# Patient Record
Sex: Female | Born: 1940 | Race: Black or African American | Hispanic: No | Smoking: Former smoker
Health system: Southern US, Community
[De-identification: ages and names within clinical notes are randomized; demographics above are authoritative.]

## PROBLEM LIST (undated history)

## (undated) DIAGNOSIS — I639 Cerebral infarction, unspecified: Secondary | ICD-10-CM

## (undated) DIAGNOSIS — M1990 Unspecified osteoarthritis, unspecified site: Secondary | ICD-10-CM

## (undated) DIAGNOSIS — K219 Gastro-esophageal reflux disease without esophagitis: Secondary | ICD-10-CM

## (undated) DIAGNOSIS — I10 Essential (primary) hypertension: Secondary | ICD-10-CM

## (undated) DIAGNOSIS — F0390 Unspecified dementia without behavioral disturbance: Secondary | ICD-10-CM

## (undated) DIAGNOSIS — D649 Anemia, unspecified: Secondary | ICD-10-CM

## (undated) DIAGNOSIS — F329 Major depressive disorder, single episode, unspecified: Secondary | ICD-10-CM

## (undated) DIAGNOSIS — I509 Heart failure, unspecified: Secondary | ICD-10-CM

## (undated) DIAGNOSIS — I2510 Atherosclerotic heart disease of native coronary artery without angina pectoris: Secondary | ICD-10-CM

## (undated) DIAGNOSIS — N189 Chronic kidney disease, unspecified: Secondary | ICD-10-CM

## (undated) DIAGNOSIS — J45909 Unspecified asthma, uncomplicated: Secondary | ICD-10-CM

## (undated) DIAGNOSIS — J189 Pneumonia, unspecified organism: Secondary | ICD-10-CM

## (undated) DIAGNOSIS — F32A Depression, unspecified: Secondary | ICD-10-CM

## (undated) DIAGNOSIS — F419 Anxiety disorder, unspecified: Secondary | ICD-10-CM

## (undated) DIAGNOSIS — E119 Type 2 diabetes mellitus without complications: Secondary | ICD-10-CM

## (undated) HISTORY — PX: EYE SURGERY: SHX253

## (undated) HISTORY — PX: CATARACT EXTRACTION, BILATERAL: SHX1313

## (undated) HISTORY — PX: TUBAL LIGATION: SHX77

## (undated) HISTORY — PX: CORONARY ANGIOPLASTY: SHX604

---

## 2017-05-13 ENCOUNTER — Inpatient Hospital Stay (HOSPITAL_COMMUNITY)
Admission: EM | Admit: 2017-05-13 | Discharge: 2017-05-22 | DRG: 371 | Disposition: A | Discharge status: Home / Self Care | Payer: Medicare (Managed Care) | Attending: Internal Medicine | Admitting: Internal Medicine

## 2017-05-13 ENCOUNTER — Encounter (HOSPITAL_COMMUNITY): Payer: Self-pay

## 2017-05-13 ENCOUNTER — Emergency Department (HOSPITAL_COMMUNITY): Payer: Medicare (Managed Care)

## 2017-05-13 DIAGNOSIS — N179 Acute kidney failure, unspecified: Secondary | ICD-10-CM | POA: Diagnosis present

## 2017-05-13 DIAGNOSIS — E119 Type 2 diabetes mellitus without complications: Secondary | ICD-10-CM | POA: Diagnosis not present

## 2017-05-13 DIAGNOSIS — R7989 Other specified abnormal findings of blood chemistry: Secondary | ICD-10-CM | POA: Diagnosis present

## 2017-05-13 DIAGNOSIS — K529 Noninfective gastroenteritis and colitis, unspecified: Secondary | ICD-10-CM | POA: Diagnosis present

## 2017-05-13 DIAGNOSIS — Z8673 Personal history of transient ischemic attack (TIA), and cerebral infarction without residual deficits: Secondary | ICD-10-CM

## 2017-05-13 DIAGNOSIS — Z9861 Coronary angioplasty status: Secondary | ICD-10-CM | POA: Diagnosis not present

## 2017-05-13 DIAGNOSIS — I82492 Acute embolism and thrombosis of other specified deep vein of left lower extremity: Secondary | ICD-10-CM | POA: Diagnosis present

## 2017-05-13 DIAGNOSIS — Z7951 Long term (current) use of inhaled steroids: Secondary | ICD-10-CM

## 2017-05-13 DIAGNOSIS — K219 Gastro-esophageal reflux disease without esophagitis: Secondary | ICD-10-CM | POA: Diagnosis present

## 2017-05-13 DIAGNOSIS — G9341 Metabolic encephalopathy: Secondary | ICD-10-CM | POA: Diagnosis not present

## 2017-05-13 DIAGNOSIS — E876 Hypokalemia: Secondary | ICD-10-CM | POA: Diagnosis present

## 2017-05-13 DIAGNOSIS — E669 Obesity, unspecified: Secondary | ICD-10-CM | POA: Diagnosis present

## 2017-05-13 DIAGNOSIS — R439 Unspecified disturbances of smell and taste: Secondary | ICD-10-CM | POA: Diagnosis not present

## 2017-05-13 DIAGNOSIS — Z7984 Long term (current) use of oral hypoglycemic drugs: Secondary | ICD-10-CM

## 2017-05-13 DIAGNOSIS — Z6834 Body mass index (BMI) 34.0-34.9, adult: Secondary | ICD-10-CM

## 2017-05-13 DIAGNOSIS — Z86718 Personal history of other venous thrombosis and embolism: Secondary | ICD-10-CM | POA: Diagnosis not present

## 2017-05-13 DIAGNOSIS — A0472 Enterocolitis due to Clostridium difficile, not specified as recurrent: Principal | ICD-10-CM | POA: Diagnosis present

## 2017-05-13 DIAGNOSIS — T373X5A Adverse effect of other antiprotozoal drugs, initial encounter: Secondary | ICD-10-CM | POA: Diagnosis not present

## 2017-05-13 DIAGNOSIS — O2230 Deep phlebothrombosis in pregnancy, unspecified trimester: Secondary | ICD-10-CM | POA: Diagnosis present

## 2017-05-13 DIAGNOSIS — D509 Iron deficiency anemia, unspecified: Secondary | ICD-10-CM | POA: Diagnosis present

## 2017-05-13 DIAGNOSIS — K30 Functional dyspepsia: Secondary | ICD-10-CM | POA: Diagnosis not present

## 2017-05-13 DIAGNOSIS — K6289 Other specified diseases of anus and rectum: Secondary | ICD-10-CM | POA: Diagnosis present

## 2017-05-13 DIAGNOSIS — M7989 Other specified soft tissue disorders: Secondary | ICD-10-CM | POA: Diagnosis not present

## 2017-05-13 DIAGNOSIS — R197 Diarrhea, unspecified: Secondary | ICD-10-CM

## 2017-05-13 DIAGNOSIS — I2510 Atherosclerotic heart disease of native coronary artery without angina pectoris: Secondary | ICD-10-CM | POA: Diagnosis present

## 2017-05-13 DIAGNOSIS — K625 Hemorrhage of anus and rectum: Secondary | ICD-10-CM | POA: Diagnosis not present

## 2017-05-13 DIAGNOSIS — Z7902 Long term (current) use of antithrombotics/antiplatelets: Secondary | ICD-10-CM | POA: Diagnosis not present

## 2017-05-13 DIAGNOSIS — E860 Dehydration: Secondary | ICD-10-CM | POA: Diagnosis present

## 2017-05-13 DIAGNOSIS — E871 Hypo-osmolality and hyponatremia: Secondary | ICD-10-CM | POA: Diagnosis present

## 2017-05-13 DIAGNOSIS — K5289 Other specified noninfective gastroenteritis and colitis: Secondary | ICD-10-CM | POA: Diagnosis not present

## 2017-05-13 DIAGNOSIS — I10 Essential (primary) hypertension: Secondary | ICD-10-CM | POA: Diagnosis present

## 2017-05-13 DIAGNOSIS — F0390 Unspecified dementia without behavioral disturbance: Secondary | ICD-10-CM | POA: Diagnosis present

## 2017-05-13 DIAGNOSIS — E1165 Type 2 diabetes mellitus with hyperglycemia: Secondary | ICD-10-CM | POA: Diagnosis present

## 2017-05-13 DIAGNOSIS — Z7982 Long term (current) use of aspirin: Secondary | ICD-10-CM

## 2017-05-13 DIAGNOSIS — A040 Enteropathogenic Escherichia coli infection: Secondary | ICD-10-CM | POA: Diagnosis present

## 2017-05-13 DIAGNOSIS — R112 Nausea with vomiting, unspecified: Secondary | ICD-10-CM

## 2017-05-13 DIAGNOSIS — Z87891 Personal history of nicotine dependence: Secondary | ICD-10-CM | POA: Diagnosis not present

## 2017-05-13 DIAGNOSIS — I82402 Acute embolism and thrombosis of unspecified deep veins of left lower extremity: Secondary | ICD-10-CM | POA: Diagnosis not present

## 2017-05-13 DIAGNOSIS — M79609 Pain in unspecified limb: Secondary | ICD-10-CM | POA: Diagnosis not present

## 2017-05-13 HISTORY — DX: Unspecified asthma, uncomplicated: J45.909

## 2017-05-13 HISTORY — DX: Heart failure, unspecified: I50.9

## 2017-05-13 HISTORY — DX: Type 2 diabetes mellitus without complications: E11.9

## 2017-05-13 HISTORY — DX: Essential (primary) hypertension: I10

## 2017-05-13 HISTORY — DX: Anxiety disorder, unspecified: F41.9

## 2017-05-13 HISTORY — DX: Unspecified dementia, unspecified severity, without behavioral disturbance, psychotic disturbance, mood disturbance, and anxiety: F03.90

## 2017-05-13 HISTORY — DX: Cerebral infarction, unspecified: I63.9

## 2017-05-13 HISTORY — DX: Anemia, unspecified: D64.9

## 2017-05-13 HISTORY — DX: Pneumonia, unspecified organism: J18.9

## 2017-05-13 HISTORY — DX: Chronic kidney disease, unspecified: N18.9

## 2017-05-13 HISTORY — DX: Gastro-esophageal reflux disease without esophagitis: K21.9

## 2017-05-13 HISTORY — DX: Major depressive disorder, single episode, unspecified: F32.9

## 2017-05-13 HISTORY — DX: Atherosclerotic heart disease of native coronary artery without angina pectoris: I25.10

## 2017-05-13 HISTORY — DX: Unspecified osteoarthritis, unspecified site: M19.90

## 2017-05-13 HISTORY — DX: Depression, unspecified: F32.A

## 2017-05-13 LAB — CBC
HCT: 36.1 % (ref 36.0–46.0)
Hemoglobin: 12.7 g/dL (ref 12.0–15.0)
MCH: 22.4 pg — ABNORMAL LOW (ref 26.0–34.0)
MCHC: 35.2 g/dL (ref 30.0–36.0)
MCV: 63.8 fL — AB (ref 78.0–100.0)
PLATELETS: 500 10*3/uL — AB (ref 150–400)
RBC: 5.66 MIL/uL — AB (ref 3.87–5.11)
RDW: 15.1 % (ref 11.5–15.5)
WBC: 20.3 10*3/uL — AB (ref 4.0–10.5)

## 2017-05-13 LAB — GLUCOSE, CAPILLARY
GLUCOSE-CAPILLARY: 151 mg/dL — AB (ref 65–99)
Glucose-Capillary: 166 mg/dL — ABNORMAL HIGH (ref 65–99)

## 2017-05-13 LAB — COMPREHENSIVE METABOLIC PANEL
ALT: 32 U/L (ref 14–54)
AST: 45 U/L — AB (ref 15–41)
Albumin: 2.4 g/dL — ABNORMAL LOW (ref 3.5–5.0)
Alkaline Phosphatase: 75 U/L (ref 38–126)
Anion gap: 14 (ref 5–15)
BILIRUBIN TOTAL: 0.9 mg/dL (ref 0.3–1.2)
BUN: 23 mg/dL — AB (ref 6–20)
CO2: 25 mmol/L (ref 22–32)
CREATININE: 1.48 mg/dL — AB (ref 0.44–1.00)
Calcium: 8.3 mg/dL — ABNORMAL LOW (ref 8.9–10.3)
Chloride: 90 mmol/L — ABNORMAL LOW (ref 101–111)
GFR, EST AFRICAN AMERICAN: 38 mL/min — AB (ref >60)
GFR, EST NON AFRICAN AMERICAN: 33 mL/min — AB (ref >60)
Glucose, Bld: 276 mg/dL — ABNORMAL HIGH (ref 65–99)
Potassium: 2.4 mmol/L — CL (ref 3.5–5.1)
Sodium: 129 mmol/L — ABNORMAL LOW (ref 135–145)
TOTAL PROTEIN: 6.6 g/dL (ref 6.5–8.1)

## 2017-05-13 LAB — TSH: TSH: 0.41 u[IU]/mL (ref 0.350–4.500)

## 2017-05-13 LAB — LIPASE, BLOOD: LIPASE: 33 U/L (ref 11–51)

## 2017-05-13 LAB — C DIFFICILE QUICK SCREEN W PCR REFLEX
C DIFFICLE (CDIFF) ANTIGEN: POSITIVE — AB
C Diff interpretation: DETECTED
C Diff toxin: POSITIVE — AB

## 2017-05-13 LAB — PROTIME-INR
INR: 1.15
PROTHROMBIN TIME: 14.8 s (ref 11.4–15.2)

## 2017-05-13 LAB — MAGNESIUM: MAGNESIUM: 1.7 mg/dL (ref 1.7–2.4)

## 2017-05-13 MED ORDER — POTASSIUM CHLORIDE CRYS ER 20 MEQ PO TBCR
60.0000 meq | EXTENDED_RELEASE_TABLET | Freq: Four times a day (QID) | ORAL | Status: AC
  Administered 2017-05-13 (×2): 60 meq via ORAL
  Filled 2017-05-13 (×2): qty 3

## 2017-05-13 MED ORDER — HYDROCODONE-ACETAMINOPHEN 5-325 MG PO TABS
1.0000 | ORAL_TABLET | ORAL | Status: DC | PRN
  Administered 2017-05-14: 1 via ORAL
  Filled 2017-05-13: qty 1
  Filled 2017-05-13: qty 2
  Filled 2017-05-13: qty 1

## 2017-05-13 MED ORDER — ORAL CARE MOUTH RINSE
15.0000 mL | Freq: Two times a day (BID) | OROMUCOSAL | Status: DC
  Administered 2017-05-14 – 2017-05-21 (×10): 15 mL via OROMUCOSAL

## 2017-05-13 MED ORDER — INSULIN ASPART 100 UNIT/ML ~~LOC~~ SOLN
0.0000 [IU] | Freq: Three times a day (TID) | SUBCUTANEOUS | Status: DC
  Administered 2017-05-13: 2 [IU] via SUBCUTANEOUS
  Administered 2017-05-14 (×2): 1 [IU] via SUBCUTANEOUS
  Administered 2017-05-14: 2 [IU] via SUBCUTANEOUS
  Administered 2017-05-15 – 2017-05-16 (×3): 1 [IU] via SUBCUTANEOUS
  Administered 2017-05-17 – 2017-05-18 (×2): 2 [IU] via SUBCUTANEOUS
  Administered 2017-05-19 – 2017-05-21 (×8): 1 [IU] via SUBCUTANEOUS
  Administered 2017-05-22: 2 [IU] via SUBCUTANEOUS

## 2017-05-13 MED ORDER — ESCITALOPRAM OXALATE 10 MG PO TABS
10.0000 mg | ORAL_TABLET | Freq: Every day | ORAL | Status: DC
  Administered 2017-05-13 – 2017-05-22 (×9): 10 mg via ORAL
  Filled 2017-05-13 (×10): qty 1

## 2017-05-13 MED ORDER — CLOPIDOGREL BISULFATE 75 MG PO TABS
75.0000 mg | ORAL_TABLET | Freq: Every day | ORAL | Status: DC
  Administered 2017-05-13 – 2017-05-16 (×4): 75 mg via ORAL
  Filled 2017-05-13 (×4): qty 1

## 2017-05-13 MED ORDER — ALBUTEROL SULFATE (2.5 MG/3ML) 0.083% IN NEBU
2.5000 mg | INHALATION_SOLUTION | RESPIRATORY_TRACT | Status: DC | PRN
  Administered 2017-05-13 – 2017-05-20 (×4): 2.5 mg via RESPIRATORY_TRACT
  Filled 2017-05-13 (×4): qty 3

## 2017-05-13 MED ORDER — VANCOMYCIN 50 MG/ML ORAL SOLUTION
125.0000 mg | Freq: Four times a day (QID) | ORAL | Status: DC
  Administered 2017-05-13 – 2017-05-19 (×22): 125 mg via ORAL
  Filled 2017-05-13 (×23): qty 2.5

## 2017-05-13 MED ORDER — ATENOLOL 25 MG PO TABS
25.0000 mg | ORAL_TABLET | Freq: Every day | ORAL | Status: DC
  Administered 2017-05-13 – 2017-05-21 (×8): 25 mg via ORAL
  Filled 2017-05-13 (×8): qty 1

## 2017-05-13 MED ORDER — HEPARIN SODIUM (PORCINE) 5000 UNIT/ML IJ SOLN
5000.0000 [IU] | Freq: Three times a day (TID) | INTRAMUSCULAR | Status: DC
  Administered 2017-05-13 – 2017-05-15 (×6): 5000 [IU] via SUBCUTANEOUS
  Filled 2017-05-13 (×6): qty 1

## 2017-05-13 MED ORDER — SODIUM CHLORIDE 0.9 % IV BOLUS (SEPSIS)
1000.0000 mL | Freq: Once | INTRAVENOUS | Status: AC
  Administered 2017-05-13: 1000 mL via INTRAVENOUS

## 2017-05-13 MED ORDER — POTASSIUM CHLORIDE CRYS ER 20 MEQ PO TBCR
40.0000 meq | EXTENDED_RELEASE_TABLET | Freq: Once | ORAL | Status: AC
  Administered 2017-05-13: 40 meq via ORAL
  Filled 2017-05-13: qty 2

## 2017-05-13 MED ORDER — ASPIRIN EC 81 MG PO TBEC
81.0000 mg | DELAYED_RELEASE_TABLET | Freq: Every day | ORAL | Status: DC
  Administered 2017-05-13 – 2017-05-16 (×4): 81 mg via ORAL
  Filled 2017-05-13 (×4): qty 1

## 2017-05-13 MED ORDER — ACETAMINOPHEN 650 MG RE SUPP: 650.0000 mg | Freq: Four times a day (QID) | RECTAL | Status: DC | PRN

## 2017-05-13 MED ORDER — POTASSIUM CHLORIDE 10 MEQ/100ML IV SOLN
10.0000 meq | INTRAVENOUS | Status: AC
  Administered 2017-05-13 (×2): 10 meq via INTRAVENOUS
  Filled 2017-05-13 (×2): qty 100

## 2017-05-13 MED ORDER — CIPROFLOXACIN IN D5W 400 MG/200ML IV SOLN
400.0000 mg | Freq: Once | INTRAVENOUS | Status: AC
  Administered 2017-05-13: 400 mg via INTRAVENOUS
  Filled 2017-05-13: qty 200

## 2017-05-13 MED ORDER — ACETAMINOPHEN 325 MG PO TABS: 650.0000 mg | ORAL_TABLET | Freq: Four times a day (QID) | ORAL | Status: DC | PRN

## 2017-05-13 MED ORDER — METRONIDAZOLE IN NACL 5-0.79 MG/ML-% IV SOLN
500.0000 mg | Freq: Once | INTRAVENOUS | Status: AC
  Administered 2017-05-13: 500 mg via INTRAVENOUS
  Filled 2017-05-13: qty 100

## 2017-05-13 MED ORDER — CIPROFLOXACIN IN D5W 400 MG/200ML IV SOLN: 400.0000 mg | Freq: Two times a day (BID) | INTRAVENOUS | Status: DC

## 2017-05-13 MED ORDER — SACCHAROMYCES BOULARDII 250 MG PO CAPS
250.0000 mg | ORAL_CAPSULE | Freq: Two times a day (BID) | ORAL | Status: DC
  Administered 2017-05-13 – 2017-05-22 (×17): 250 mg via ORAL
  Filled 2017-05-13 (×17): qty 1

## 2017-05-13 MED ORDER — CHLORHEXIDINE GLUCONATE 0.12 % MT SOLN
15.0000 mL | Freq: Two times a day (BID) | OROMUCOSAL | Status: DC
  Administered 2017-05-13 – 2017-05-22 (×12): 15 mL via OROMUCOSAL
  Filled 2017-05-13 (×13): qty 15

## 2017-05-13 MED ORDER — MAGNESIUM SULFATE 2 GM/50ML IV SOLN
2.0000 g | Freq: Once | INTRAVENOUS | Status: AC
  Administered 2017-05-13: 2 g via INTRAVENOUS
  Filled 2017-05-13: qty 50

## 2017-05-13 MED ORDER — ONDANSETRON HCL 4 MG PO TABS: 4.0000 mg | ORAL_TABLET | Freq: Four times a day (QID) | ORAL | Status: DC | PRN

## 2017-05-13 MED ORDER — ONDANSETRON HCL 4 MG/2ML IJ SOLN
4.0000 mg | Freq: Four times a day (QID) | INTRAMUSCULAR | Status: DC | PRN
  Filled 2017-05-13: qty 2

## 2017-05-13 MED ORDER — MAGNESIUM OXIDE 400 (241.3 MG) MG PO TABS
800.0000 mg | ORAL_TABLET | Freq: Once | ORAL | Status: AC
  Administered 2017-05-13: 800 mg via ORAL
  Filled 2017-05-13: qty 2

## 2017-05-13 MED ORDER — SODIUM CHLORIDE 0.9 % IV SOLN
INTRAVENOUS | Status: DC
  Administered 2017-05-14 (×3): via INTRAVENOUS

## 2017-05-13 NOTE — Progress Notes (Signed)
Patient positive for c diff antigen and toxin.  Dr. Arthor CaptainElmahi notified, with orders to discontinue cipro, and start florastor bid.

## 2017-05-13 NOTE — ED Triage Notes (Signed)
She reports diarrhea and initially some n/v since last Friday. She saw her pcp; and also was seen here and dx with "norovirus". She is here today with c/o decreased appetite and persistent diarrhea and incontinence of urine and stool.

## 2017-05-13 NOTE — ED Provider Notes (Signed)
[facility]-EMERGENCY DEPT Provider Note   CSN: [id number] Arrival date & time: 05/13/17  0913     History   Chief Complaint Chief Complaint  Patient presents with  . Diarrhea    HPI Shirley Collins is a 76 y.o. female.  76 yo F with a chief complaints of nausea vomiting and diarrhea. Going on for the course of the week. No continued vomiting but continues to have multiple episodes of diarrhea a day. Told the family physician about 4 days ago was told she likely has enterovirus and has been pushing fluids at home. Worsening mental status today. Family states that she's been mildly confused and stooling on herself. Deny fevers. Having some mild diffuse abdominal pain. Still tolerating by mouth.   The history is provided by the patient.  Diarrhea   This is a new problem. The current episode started more than 2 days ago. The problem occurs more than 10 times per day. The problem has been gradually worsening. Associated symptoms include abdominal pain and cough. Pertinent negatives include no vomiting, no chills, no headaches, no arthralgias and no myalgias. She has tried nothing for the symptoms. The treatment provided no relief.    No past medical history on file.  Patient Active Problem List   Diagnosis Date Noted  . Colitis 05/13/2017  . Hyponatremia 05/13/2017  . Hypokalemia 05/13/2017  . Type 2 diabetes mellitus without complication (HCC) 05/13/2017  . AKI (acute kidney injury) (HCC) 05/13/2017  . Dehydration 05/13/2017    No past surgical history on file.  OB History    No data available       Home Medications    Prior to Admission medications   Medication Sig Start Date End Date Taking? Authorizing Provider  acetaminophen (TYLENOL) 650 MG CR tablet Take 1,300 mg by mouth every 8 (eight) hours as needed for pain.   Yes [provider]  amLODipine (NORVASC) 10 MG tablet Take 10 mg by mouth daily. 03/02/17  Yes [provider]  aspirin EC 81 MG tablet Take  81 mg by mouth daily.   Yes [provider]  atenolol (TENORMIN) 25 MG tablet Take 25 mg by mouth daily. 04/27/17  Yes [provider]  Cholecalciferol (D-3-5) 5000 units capsule Take 5,000 Units by mouth daily.   Yes [provider]  clopidogrel (PLAVIX) 75 MG tablet Take 75 mg by mouth daily. 04/25/17  Yes [provider]  DULoxetine (CYMBALTA) 30 MG capsule Take 30 mg by mouth daily. 04/25/17  Yes [provider]  DULoxetine (CYMBALTA) 60 MG capsule Take 60 mg by mouth every morning. 04/25/17  Yes [provider]  escitalopram (LEXAPRO) 10 MG tablet Take 10 mg by mouth daily. 04/25/17  Yes [provider]  ferrous sulfate 325 (65 FE) MG tablet Take 325 mg by mouth 3 (three) times daily with meals.   Yes [provider]  JARDIANCE 10 MG TABS tablet Take 10 mg by mouth daily. 05/12/17  Yes [provider]  pantoprazole (PROTONIX) 40 MG tablet Take 80 mg by mouth daily. 04/25/17  Yes [provider]  pioglitazone (ACTOS) 30 MG tablet Take 30 mg by mouth daily. 04/25/17  Yes [provider]  triamterene-hydrochlorothiazide (MAXZIDE-25) 37.5-25 MG tablet Take 1 tablet by mouth daily. 04/25/17  Yes [provider]  VENTOLIN HFA 108 (90 Base) MCG/ACT inhaler Inhale 1 puff into the lungs every 6 (six) hours as needed for wheezing or shortness of breath. 02/11/17  Yes [provider]  Family History No family history on file.  Social History Social History  Substance Use Topics  . Smoking status: Not on file  . Smokeless tobacco: Not on file  . Alcohol use Not on file     Allergies   Patient has no known allergies.   Review of Systems Review of Systems  Constitutional: Negative for chills and fever.  HENT: Negative for congestion and rhinorrhea.   Eyes: Negative for redness and visual disturbance.  Respiratory: Positive for cough. Negative for shortness of breath and wheezing.     Cardiovascular: Negative for chest pain and palpitations.  Gastrointestinal: Positive for abdominal pain, diarrhea and nausea. Negative for vomiting.  Genitourinary: Negative for dysuria and urgency.  Musculoskeletal: Negative for arthralgias and myalgias.  Skin: Negative for pallor and wound.  Neurological: Positive for weakness. Negative for dizziness and headaches.     Physical Exam Updated Vital Signs BP (!) 117/58 (BP Location: Right Arm)   Pulse (!) 105   Temp 98.7 F (37.1 C) (Oral)   Resp 18   SpO2 97%   Physical Exam  Constitutional: She is oriented to person, place, and time. She appears well-developed and well-nourished. No distress.  Generally weak  HENT:  Head: Normocephalic and atraumatic.  Eyes: EOM are normal. Pupils are equal, round, and reactive to light.  Neck: Normal range of motion. Neck supple.  Cardiovascular: Normal rate and regular rhythm.  Exam reveals no gallop and no friction rub.   No murmur heard. Pulmonary/Chest: Effort normal. She has no wheezes. She has no rales.  Abdominal: Soft. She exhibits no distension and no mass. There is no tenderness. There is no guarding.  obese  Musculoskeletal: She exhibits no edema or tenderness.  Neurological: She is alert and oriented to person, place, and time.  Skin: Skin is warm and dry. She is not diaphoretic.  Psychiatric: She has a normal mood and affect. Her behavior is normal.  Nursing note and vitals reviewed.    ED Treatments / Results  Labs (all labs ordered are listed, but only abnormal results are displayed) Labs Reviewed  COMPREHENSIVE METABOLIC PANEL - Abnormal; Notable for the following:       Result Value   Sodium 129 (*)    Potassium 2.4 (*)    Chloride 90 (*)    Glucose, Bld 276 (*)    BUN 23 (*)    Creatinine, Ser 1.48 (*)    Calcium 8.3 (*)    Albumin 2.4 (*)    AST 45 (*)    GFR calc non Af Amer 33 (*)    GFR calc Af Amer 38 (*)    All other components within normal limits   CBC - Abnormal; Notable for the following:    WBC 20.3 (*)    RBC 5.66 (*)    MCV 63.8 (*)    MCH 22.4 (*)    Platelets 500 (*)    All other components within normal limits  C DIFFICILE QUICK SCREEN W PCR REFLEX  GASTROINTESTINAL PANEL BY PCR, STOOL (REPLACES STOOL CULTURE)  LIPASE, BLOOD  MAGNESIUM  URINALYSIS, ROUTINE W REFLEX MICROSCOPIC    EKG  EKG Interpretation  Date/Time:  Wednesday May 13 2017 11:38:00 EDT Ventricular Rate:  88 PR Interval:    QRS Duration: 108 QT Interval:  388 QTC Calculation: 470 R Axis:   -38 Text Interpretation:  Sinus rhythm Atrial premature complex Incomplete left bundle branch block Inferior infarct, old No old tracing to compare Confirmed by Adela LankFloyd,  Jesusita Oka [id number]) on 05/13/2017 11:42:21 AM Also confirmed by Melene Plan [phone]), editor Misty Stanley [phone])  on 05/13/2017 11:57:47 AM       Radiology Ct Abdomen Pelvis Wo Contrast  Result Date: 05/13/2017 CLINICAL DATA:  Abdominal pain and diarrhea EXAM: CT ABDOMEN AND PELVIS WITHOUT CONTRAST TECHNIQUE: Multidetector CT imaging of the abdomen and pelvis was performed following the standard protocol without IV contrast. COMPARISON:  None. FINDINGS: Lower chest: Lung bases are free of acute infiltrate or sizable effusion. Tiny 2 mm nodule is noted along the major fissure on the right best seen on image number 6 of series 3. Hepatobiliary: No focal liver abnormality is seen. No gallstones, gallbladder wall thickening, or biliary dilatation. Pancreas: Unremarkable. No pancreatic ductal dilatation or surrounding inflammatory changes. Spleen: Normal in size without focal abnormality. Adrenals/Urinary Tract: Adrenal glands are within normal limits. Left kidney demonstrates multiple hypodensities likely represent cysts. No obstructive changes are seen. The right kidney is considerably smaller than the left with a 5 mm calculus in the lower pole. No obstructive changes are seen. The bladder is decompressed.  Stomach/Bowel: Scattered diverticular change of the colon is noted. Some wall thickening and pericolonic inflammatory changes are noted in the ascending colon and proximal transverse colon consistent with a focal colitis. The appendix is within normal limits. No obstructive changes are seen. Small sliding-type hiatal hernia is noted. Vascular/Lymphatic: Aortic atherosclerosis. No enlarged abdominal or pelvic lymph nodes. Reproductive: Uterus and bilateral adnexa are unremarkable. Other: No abdominal wall hernia or abnormality. No abdominopelvic ascites. Musculoskeletal: Degenerative change of the lumbar spine is seen. IMPRESSION: Tiny 2 mm nodule in the right lower lobe. No follow-up needed if patient is low-risk. Non-contrast chest CT can be considered in 12 months if patient is high-risk. This recommendation follows the consensus statement: Guidelines for Management of Incidental Pulmonary Nodules Detected on CT Images: From the Fleischner Society 2017; Radiology 2017; 284:228-243. Left renal cysts and small right renal stone without obstruction. Diverticulosis without diverticulitis. Diffuse colitis involving the ascending colon and proximal transverse colon. No abscess or perforation is seen. Electronically Signed   By: Alcide Clever M.D.   On: 05/13/2017 11:42   Dg Chest 2 View  Result Date: 05/13/2017 CLINICAL DATA:  76 year old female with recent norovirus illness. New cough. Ongoing nausea vomiting diarrhea. EXAM: CHEST  2 VIEW COMPARISON:  CT Abdomen and Pelvis 1124 hours today. FINDINGS: Semi upright AP and lateral views of the chest. Low lung volumes. Mild cardiomegaly. Calcified aortic atherosclerosis. No pneumothorax, pulmonary edema, pleural effusion or pneumoperitoneum. Mild left lung base atelectasis. No other abnormal pulmonary opacity. Flowing osteophytes in the spine. IMPRESSION: 1. Low lung volumes with mild atelectasis. No acute cardiopulmonary abnormality. 2.  Calcified aortic  atherosclerosis. Electronically Signed   By: Odessa Fleming M.D.   On: 05/13/2017 12:07    Procedures Procedures (including critical care time)  Medications Ordered in ED Medications  potassium chloride 10 mEq in 100 mL IVPB (10 mEq Intravenous New Bag/Given 05/13/17 1300)  ciprofloxacin (CIPRO) IVPB 400 mg (400 mg Intravenous New Bag/Given 05/13/17 1251)  metroNIDAZOLE (FLAGYL) IVPB 500 mg (500 mg Intravenous New Bag/Given 05/13/17 1251)  potassium chloride SA (K-DUR,KLOR-CON) CR tablet 40 mEq (40 mEq Oral Given 05/13/17 1149)  magnesium oxide (MAG-OX) tablet 800 mg (800 mg Oral Given 05/13/17 1155)  sodium chloride 0.9 % bolus 1,000 mL (1,000 mLs Intravenous New Bag/Given 05/13/17 1153)     Initial Impression / Assessment and Plan / ED Course  I have reviewed the  triage vital signs and the nursing notes.  Pertinent labs & imaging results that were available during my care of the patient were reviewed by me and considered in my medical decision making (see chart for details).     76 yo F With a chief complaint of nausea vomiting and diarrhea. She has failed conservative management as an outpatient and here has a potassium of 2.4. I will replenish in the ED. Give fluids. She also has a white blood cell count of 20,000, unsure of etiology. She does have a cough will obtain a chest x-ray. CT of the abdomen and pelvis. Stool studies.  Found to have colitis.  Start on cipro/flagyl.  Admit.    CRITICAL CARE Performed by: Rae Roam   Total critical care time: 35 minutes  Critical care time was exclusive of separately billable procedures and treating other patients.  Critical care was necessary to treat or prevent imminent or life-threatening deterioration.  Critical care was time spent personally by me on the following activities: development of treatment plan with patient and/or surrogate as well as nursing, discussions with consultants, evaluation of patient's response to treatment,  examination of patient, obtaining history from patient or surrogate, ordering and performing treatments and interventions, ordering and review of laboratory studies, ordering and review of radiographic studies, pulse oximetry and re-evaluation of patient's condition.  The patients results and plan were reviewed and discussed.   Any x-rays performed were independently reviewed by myself.   Differential diagnosis were considered with the presenting HPI.  Medications  potassium chloride 10 mEq in 100 mL IVPB (10 mEq Intravenous New Bag/Given 05/13/17 1300)  ciprofloxacin (CIPRO) IVPB 400 mg (400 mg Intravenous New Bag/Given 05/13/17 1251)  metroNIDAZOLE (FLAGYL) IVPB 500 mg (500 mg Intravenous New Bag/Given 05/13/17 1251)  potassium chloride SA (K-DUR,KLOR-CON) CR tablet 40 mEq (40 mEq Oral Given 05/13/17 1149)  magnesium oxide (MAG-OX) tablet 800 mg (800 mg Oral Given 05/13/17 1155)  sodium chloride 0.9 % bolus 1,000 mL (1,000 mLs Intravenous New Bag/Given 05/13/17 1153)    Vitals:   05/13/17 0941  BP: (!) 117/58  Pulse: (!) 105  Resp: 18  Temp: 98.7 F (37.1 C)  TempSrc: Oral  SpO2: 97%    Final diagnoses:  Nausea vomiting and diarrhea  Colitis  Hypokalemia    Admission/ observation were discussed with the admitting physician, patient and/or family and they are comfortable with the plan.   Final Clinical Impressions(s) / ED Diagnoses   Final diagnoses:  Nausea vomiting and diarrhea  Colitis  Hypokalemia    New Prescriptions Current Discharge Medication List       Melene Plan, DO 05/13/17 1348

## 2017-05-13 NOTE — Progress Notes (Signed)
Page: Shirley Chessmanm [id number] Record is has expiratory wheezes. Her daughter states she uses an albuterol inhaler @ home Q4 PRN

## 2017-05-13 NOTE — H&P (Signed)
History and Physical    Shirley Collins RUE:[id number] DOB: 1941-09-23 DOA: 05/13/2017  PCP: Sherrell Puller R  Patient coming from: Home  Chief Complaint: Diarrhea and generalized weakness  HPI: Shirley Collins is a 76 y.o. female with medical history significant of HTN and diabetes mellitus type 2 given to the hospital because of diarrhea and generalized weakness. Patient is from [place], she went to her primary care physician 5 days prior to admission for nausea, vomiting, abdominal pain and diarrhea. She was told she probably has Norovirus, and let it take its course. Her daughter who lives in [place] brought her here because she was getting weaker and weaker. Reported her nausea and vomiting resolved but the diarrhea got worse to the point she became incontinent this morning, became extremely weak so she came to the hospital for further evaluation. In the ED initial evaluation showed potassium of 2.4 and CT scan of abdomen/pelvis showed diffuse colitis.  ED Course:  Vitals: WNL Labs: Potassium 2.4, WBC is 20.3, creatinine 1.4 Imaging: CT of abdomen/pelvis showed diffuse colitis Interventions: Given Cipro/Flagyl  Review of Systems:  Constitutional: negative for anorexia, fevers and sweats Eyes: negative for irritation, redness and visual disturbance Ears, nose, mouth, throat, and face: negative for earaches, epistaxis, nasal congestion and sore throat Respiratory: negative for cough, dyspnea on exertion, sputum and wheezing Cardiovascular: negative for chest pain, dyspnea, lower extremity edema, orthopnea, palpitations and syncope Gastrointestinal: negative for abdominal pain, constipation, diarrhea, melena, nausea and vomiting Genitourinary:negative for dysuria, frequency and hematuria Hematologic/lymphatic: negative for bleeding, easy bruising and lymphadenopathy Musculoskeletal:negative for arthralgias, muscle weakness and stiff joints Neurological: negative for coordination  problems, gait problems, headaches and weakness Endocrine: negative for diabetic symptoms including polydipsia, polyuria and weight loss Allergic/Immunologic: negative for anaphylaxis, hay fever and urticaria  No past medical history on file.  No past surgical history on file.   has no tobacco, alcohol, and drug history on file.  No Known Allergies  Family history: No family history of colon cancer  Prior to Admission medications   Medication Sig Start Date End Date Taking? Authorizing Provider  acetaminophen (TYLENOL) 650 MG CR tablet Take 1,300 mg by mouth every 8 (eight) hours as needed for pain.   Yes [provider]  amLODipine (NORVASC) 10 MG tablet Take 10 mg by mouth daily. 03/02/17  Yes [provider]  aspirin EC 81 MG tablet Take 81 mg by mouth daily.   Yes [provider]  atenolol (TENORMIN) 25 MG tablet Take 25 mg by mouth daily. 04/27/17  Yes [provider]  Cholecalciferol (D-3-5) 5000 units capsule Take 5,000 Units by mouth daily.   Yes [provider]  clopidogrel (PLAVIX) 75 MG tablet Take 75 mg by mouth daily. 04/25/17  Yes [provider]  DULoxetine (CYMBALTA) 30 MG capsule Take 30 mg by mouth daily. 04/25/17  Yes [provider]  DULoxetine (CYMBALTA) 60 MG capsule Take 60 mg by mouth every morning. 04/25/17  Yes [provider]  escitalopram (LEXAPRO) 10 MG tablet Take 10 mg by mouth daily. 04/25/17  Yes [provider]  ferrous sulfate 325 (65 FE) MG tablet Take 325 mg by mouth 3 (three) times daily with meals.   Yes [provider]  JARDIANCE 10 MG TABS tablet Take 10 mg by mouth daily. 05/12/17  Yes [provider]  pantoprazole (PROTONIX) 40 MG tablet Take 80 mg by mouth daily. 04/25/17  Yes [provider]  pioglitazone (ACTOS) 30 MG tablet Take 30 mg  by mouth daily. 04/25/17  Yes [provider]  triamterene-hydrochlorothiazide (MAXZIDE-25) 37.5-25 MG tablet  Take 1 tablet by mouth daily. 04/25/17  Yes [provider]  VENTOLIN HFA 108 (90 Base) MCG/ACT inhaler Inhale 1 puff into the lungs every 6 (six) hours as needed for wheezing or shortness of breath. 02/11/17  Yes [provider]    Physical Exam:  Vitals:   05/13/17 0941  BP: (!) 117/58  Pulse: (!) 105  Resp: 18  Temp: 98.7 F (37.1 C)  TempSrc: Oral  SpO2: 97%    Constitutional: NAD, calm, comfortable Eyes: PERRL, lids and conjunctivae normal ENMT: Mucous membranes are moist. Posterior pharynx clear of any exudate or lesions.Normal dentition.  Neck: normal, supple, no masses, no thyromegaly Respiratory: clear to auscultation bilaterally, no wheezing, no crackles. Normal respiratory effort. No accessory muscle use.  Cardiovascular: Regular rate and rhythm, no murmurs / rubs / gallops. No extremity edema. 2+ pedal pulses. No carotid bruits.  Abdomen: no tenderness, no masses palpated. No hepatosplenomegaly. Bowel sounds positive.  Musculoskeletal: no clubbing / cyanosis. No joint deformity upper and lower extremities. Good ROM, no contractures. Normal muscle tone.  Skin: no rashes, lesions, ulcers. No induration Neurologic: CN 2-12 grossly intact. Sensation intact, DTR normal. Strength 5/5 in all 4.  Psychiatric: Normal judgment and insight. Alert and oriented x 3. Normal mood.   Labs on Admission: I have personally reviewed following labs and imaging studies  CBC:  Recent Labs Lab 05/13/17 0945  WBC 20.3*  HGB 12.7  HCT 36.1  MCV 63.8*  PLT 500*   Basic Metabolic Panel:  Recent Labs Lab 05/13/17 0945  NA 129*  K 2.4*  CL 90*  CO2 25  GLUCOSE 276*  BUN 23*  CREATININE 1.48*  CALCIUM 8.3*  MG 1.7   GFR: CrCl cannot be calculated (Unknown ideal weight.). Liver Function Tests:  Recent Labs Lab 05/13/17 0945  AST 45*  ALT 32  ALKPHOS 75  BILITOT 0.9  PROT 6.6  ALBUMIN 2.4*    Recent Labs Lab 05/13/17 0945  LIPASE 33   No results  for input(s): AMMONIA in the last 168 hours. Coagulation Profile: No results for input(s): INR, PROTIME in the last 168 hours. Cardiac Enzymes: No results for input(s): CKTOTAL, CKMB, CKMBINDEX, TROPONINI in the last 168 hours. BNP (last 3 results) No results for input(s): PROBNP in the last 8760 hours. HbA1C: No results for input(s): HGBA1C in the last 72 hours. CBG: No results for input(s): GLUCAP in the last 168 hours. Lipid Profile: No results for input(s): CHOL, HDL, LDLCALC, TRIG, CHOLHDL, LDLDIRECT in the last 72 hours. Thyroid Function Tests: No results for input(s): TSH, T4TOTAL, FREET4, T3FREE, THYROIDAB in the last 72 hours. Anemia Panel: No results for input(s): VITAMINB12, FOLATE, FERRITIN, TIBC, IRON, RETICCTPCT in the last 72 hours. Urine analysis: No results found for: COLORURINE, APPEARANCEUR, LABSPEC, PHURINE, GLUCOSEU, HGBUR, BILIRUBINUR, KETONESUR, PROTEINUR, UROBILINOGEN, NITRITE, LEUKOCYTESUR Sepsis Labs: !!!!!!!!!!!!!!!!!!!!!!!!!!!!!!!!!!!!!!!!!!!! Invalid input(s): PROCALCITONIN, LACTICIDVEN No results found for this or any previous visit (from the past 240 hour(s)).   Radiological Exams on Admission: Ct Abdomen Pelvis Wo Contrast  Result Date: 05/13/2017 CLINICAL DATA:  Abdominal pain and diarrhea EXAM: CT ABDOMEN AND PELVIS WITHOUT CONTRAST TECHNIQUE: Multidetector CT imaging of the abdomen and pelvis was performed following the standard protocol without IV contrast. COMPARISON:  None. FINDINGS: Lower chest: Lung bases are free of acute infiltrate or sizable effusion. Tiny 2 mm nodule is noted along the major fissure on the right best  seen on image number 6 of series 3. Hepatobiliary: No focal liver abnormality is seen. No gallstones, gallbladder wall thickening, or biliary dilatation. Pancreas: Unremarkable. No pancreatic ductal dilatation or surrounding inflammatory changes. Spleen: Normal in size without focal abnormality. Adrenals/Urinary Tract: Adrenal glands  are within normal limits. Left kidney demonstrates multiple hypodensities likely represent cysts. No obstructive changes are seen. The right kidney is considerably smaller than the left with a 5 mm calculus in the lower pole. No obstructive changes are seen. The bladder is decompressed. Stomach/Bowel: Scattered diverticular change of the colon is noted. Some wall thickening and pericolonic inflammatory changes are noted in the ascending colon and proximal transverse colon consistent with a focal colitis. The appendix is within normal limits. No obstructive changes are seen. Small sliding-type hiatal hernia is noted. Vascular/Lymphatic: Aortic atherosclerosis. No enlarged abdominal or pelvic lymph nodes. Reproductive: Uterus and bilateral adnexa are unremarkable. Other: No abdominal wall hernia or abnormality. No abdominopelvic ascites. Musculoskeletal: Degenerative change of the lumbar spine is seen. IMPRESSION: Tiny 2 mm nodule in the right lower lobe. No follow-up needed if patient is low-risk. Non-contrast chest CT can be considered in 12 months if patient is high-risk. This recommendation follows the consensus statement: Guidelines for Management of Incidental Pulmonary Nodules Detected on CT Images: From the Fleischner Society 2017; Radiology 2017; 284:228-243. Left renal cysts and small right renal stone without obstruction. Diverticulosis without diverticulitis. Diffuse colitis involving the ascending colon and proximal transverse colon. No abscess or perforation is seen. Electronically Signed   By: Alcide Clever M.D.   On: 05/13/2017 11:42   Dg Chest 2 View  Result Date: 05/13/2017 CLINICAL DATA:  76 year old female with recent norovirus illness. New cough. Ongoing nausea vomiting diarrhea. EXAM: CHEST  2 VIEW COMPARISON:  CT Abdomen and Pelvis 1124 hours today. FINDINGS: Semi upright AP and lateral views of the chest. Low lung volumes. Mild cardiomegaly. Calcified aortic atherosclerosis. No pneumothorax,  pulmonary edema, pleural effusion or pneumoperitoneum. Mild left lung base atelectasis. No other abnormal pulmonary opacity. Flowing osteophytes in the spine. IMPRESSION: 1. Low lung volumes with mild atelectasis. No acute cardiopulmonary abnormality. 2.  Calcified aortic atherosclerosis. Electronically Signed   By: Odessa Fleming M.D.   On: 05/13/2017 12:07    EKG: Independently reviewed.   Assessment/Plan Principal Problem:   Colitis Active Problems:   Hyponatremia   Hypokalemia   Type 2 diabetes mellitus without complication (HCC)   AKI (acute kidney injury) (HCC)   Dehydration    Colitis -Abdominal pain, diarrhea with CT scan findings of diffuse abdominal colitis. -Also has significant leukocytosis with WBCs of 20.3, I will treat as C. difficile colitis till proven otherwise. -Started on Flagyl and Cipro by ADP, will continue Cipro and vancomycin. -Stools studies PCR with and rule out C. difficile colitis. -Patient doesn't remember the last time she had antibiotics but she "gets pneumonia regularly".  Acute kidney injury -Renal function baseline unknown, creatinine is 1.4 on presentation, presumed to be acute kidney injury. -Hold nephrotoxic medications including Maxzide. -Hydrate with IV fluids, check BMP in a.m.  Hypokalemia   -Sodium-potassium is 2.4, this is likely secondary to diarrhea, replete with oral and parenteral supplements. -Check BMP in a.m.  Hyponatremia -Sodium of 129, likely secondary to dehydration. -Hydrate with IV fluids, check BMP in a.m.  Diabetes mellitus type 2 -Hold oral hypoglycemic agents, SSI, check hemoglobin A1c.  HTN -Hold diuretics, continue atenolol to avoid rebound tachycardia.   DVT prophylaxis: SQ Heparin Code Status: Full code Family Communication:  Plan D/W patient with her daughter at bedside. Disposition Plan: Home Consults called:  Admission status: Inpatient, MedSurg   [facility] A MD [facility] Pager  [phone]  If 7PM-7AM, please contact night-coverage [email or web address] Password [email or web address]  05/13/2017, 1:17 PM

## 2017-05-13 NOTE — ED Notes (Signed)
Report given to floor RN

## 2017-05-14 LAB — BASIC METABOLIC PANEL
Anion gap: 10 (ref 5–15)
BUN: 25 mg/dL — AB (ref 6–20)
CALCIUM: 8.3 mg/dL — AB (ref 8.9–10.3)
CO2: 24 mmol/L (ref 22–32)
Chloride: 102 mmol/L (ref 101–111)
Creatinine, Ser: 1.23 mg/dL — ABNORMAL HIGH (ref 0.44–1.00)
GFR calc Af Amer: 48 mL/min — ABNORMAL LOW (ref >60)
GFR, EST NON AFRICAN AMERICAN: 42 mL/min — AB (ref >60)
GLUCOSE: 140 mg/dL — AB (ref 65–99)
Potassium: 3.4 mmol/L — ABNORMAL LOW (ref 3.5–5.1)
Sodium: 136 mmol/L (ref 135–145)

## 2017-05-14 LAB — GASTROINTESTINAL PANEL BY PCR, STOOL (REPLACES STOOL CULTURE)
ADENOVIRUS F40/41: NOT DETECTED
ASTROVIRUS: NOT DETECTED
CAMPYLOBACTER SPECIES: NOT DETECTED
Cryptosporidium: NOT DETECTED
Cyclospora cayetanensis: NOT DETECTED
ENTEROPATHOGENIC E COLI (EPEC): DETECTED — AB
ENTEROTOXIGENIC E COLI (ETEC): NOT DETECTED
Entamoeba histolytica: NOT DETECTED
Enteroaggregative E coli (EAEC): NOT DETECTED
Giardia lamblia: NOT DETECTED
NOROVIRUS GI/GII: NOT DETECTED
PLESIMONAS SHIGELLOIDES: NOT DETECTED
ROTAVIRUS A: NOT DETECTED
SAPOVIRUS (I, II, IV, AND V): NOT DETECTED
SHIGA LIKE TOXIN PRODUCING E COLI (STEC): NOT DETECTED
Salmonella species: NOT DETECTED
Shigella/Enteroinvasive E coli (EIEC): NOT DETECTED
VIBRIO SPECIES: NOT DETECTED
Vibrio cholerae: NOT DETECTED
Yersinia enterocolitica: NOT DETECTED

## 2017-05-14 LAB — CBC
HCT: 31 % — ABNORMAL LOW (ref 36.0–46.0)
Hemoglobin: 10.7 g/dL — ABNORMAL LOW (ref 12.0–15.0)
MCH: 22 pg — AB (ref 26.0–34.0)
MCHC: 34.5 g/dL (ref 30.0–36.0)
MCV: 63.8 fL — ABNORMAL LOW (ref 78.0–100.0)
PLATELETS: 508 10*3/uL — AB (ref 150–400)
RBC: 4.86 MIL/uL (ref 3.87–5.11)
RDW: 15.1 % (ref 11.5–15.5)
WBC: 18.5 10*3/uL — ABNORMAL HIGH (ref 4.0–10.5)

## 2017-05-14 LAB — GLUCOSE, CAPILLARY
GLUCOSE-CAPILLARY: 132 mg/dL — AB (ref 65–99)
Glucose-Capillary: 145 mg/dL — ABNORMAL HIGH (ref 65–99)
Glucose-Capillary: 137 mg/dL — ABNORMAL HIGH (ref 65–99)
Glucose-Capillary: 172 mg/dL — ABNORMAL HIGH (ref 65–99)

## 2017-05-14 LAB — HEMOGLOBIN A1C
Hgb A1c MFr Bld: 6.4 % — ABNORMAL HIGH (ref 4.8–5.6)
MEAN PLASMA GLUCOSE: 137 mg/dL

## 2017-05-14 LAB — MAGNESIUM: MAGNESIUM: 2.6 mg/dL — AB (ref 1.7–2.4)

## 2017-05-14 MED ORDER — POTASSIUM CHLORIDE CRYS ER 20 MEQ PO TBCR
60.0000 meq | EXTENDED_RELEASE_TABLET | Freq: Once | ORAL | Status: AC
  Administered 2017-05-14: 60 meq via ORAL
  Filled 2017-05-14: qty 3

## 2017-05-14 MED ORDER — NYSTATIN 100000 UNIT/ML MT SUSP
5.0000 mL | Freq: Four times a day (QID) | OROMUCOSAL | Status: DC
  Administered 2017-05-14: 500000 [IU] via ORAL
  Filled 2017-05-14: qty 5

## 2017-05-14 NOTE — Progress Notes (Signed)
Patient ID: Shirley Collins, female   DOB: July 25, 1941, 76 y.o.   MRN: [id number]  PROGRESS NOTE    Shirley Collins  YNW:[id number] DOB: 05/05/1941 DOA: 05/13/2017 PCP: Brenton Grills   Brief Narrative:  76 year female with history of hypertension and diabetes mellitus type 2 presented to the hospital with complaints of diarrhea and generalized weakness progressively getting worse. She was found to have diffuse colitis on CAT scan of the abdomen and pelvis and C. difficile was found to be positive. She was switched to oral vancomycin after initial ciprofloxacin and Flagyl intravenous treatment.  Assessment & Plan:   Principal Problem:   Colitis Active Problems:   Hyponatremia   Hypokalemia   Type 2 diabetes mellitus without complication (HCC)   AKI (acute kidney injury) (HCC)   Dehydration  Probable acute C. difficile Colitis -Continue oral vancomycin for total of 10-14 days. This is her first episode of C. difficile colitis. Avoid other antibiotics if necessary. Discontinue oral PPI. -Patient's diarrhea is improving but still having significant diarrhea. - Advance diet as tolerated   Acute kidney injury -Renal function baseline unknown, creatinine was 1.4 on presentation, presumed to be acute kidney injury. Creatinine has improved to 1.23 this morning. Continue IV fluids repeat a.m. BMP -Hold nephrotoxic medications including Maxzide.   Hypokalemia   - Probably secondary to diarrhea -Improving. Replace potassium. -Check BMP in a.m. including magnesium.  Hyponatremia -Improving, likely secondary to dehydration and diarrhea  Diabetes mellitus type 2 with hyperglycemia -Hold oral hypoglycemic agents, SSI  HTN -Hold diuretics, continue atenolol to avoid rebound tachycardia.  Leukocytosis Probably secondary to C. difficile colitis. Improving. Repeat a.m. Labs  Thrombocytosis Probably reactive. Repeat a.m. labs  DVT prophylaxis: Subcutaneous  heparin Code Status:  Full  code Family Communication: Discussed with daughter present at bedside Disposition Plan: Home in 1-2 days  Consultants: None  Procedures: None  Antimicrobials: Oral vancomycin from 05/13/2017 Ciprofloxacin and Flagyl on 05/13/2017 and discontinued on the same day   Subjective: Patient seen and examined at bedside. She is still having diarrhea which is improving. No current nausea vomiting fever or chest pain. Objective: Vitals:   05/13/17 2024 05/13/17 2244 05/14/17 0606 05/14/17 0820  BP: (!) 132/54  (!) 121/58 123/73  Pulse: 75  76 65  Resp: 20  20   Temp: 98.6 F (37 C)  98.5 F (36.9 C)   TempSrc: Oral  Oral   SpO2: 98% 94% 97%   Weight:      Height:        Intake/Output Summary (Last 24 hours) at 05/14/17 1201 Last data filed at 05/14/17 0549  Gross per 24 hour  Intake          1526.25 ml  Output               25 ml  Net          1501.25 ml   Filed Weights   05/13/17 1600  Weight: 90.7 kg (200 lb)    Examination:  General exam: Appears calm and comfortable  Respiratory system: Bilateral decreased breath sound at bases Cardiovascular system: S1 & S2 heard, RRR.  Gastrointestinal system: Abdomen is nondistended, soft and nontender. Normal bowel sounds heard. Extremities: No cyanosis, clubbing, edema    Data Reviewed: I have personally reviewed following labs and imaging studies  CBC:  Recent Labs Lab 05/13/17 0945 05/14/17 0735  WBC 20.3* 18.5*  HGB 12.7 10.7*  HCT 36.1 31.0*  MCV 63.8* 63.8*  PLT 500*  508*   Basic Metabolic Panel:  Recent Labs Lab 05/13/17 0945 05/14/17 0735  NA 129* 136  K 2.4* 3.4*  CL 90* 102  CO2 25 24  GLUCOSE 276* 140*  BUN 23* 25*  CREATININE 1.48* 1.23*  CALCIUM 8.3* 8.3*  MG 1.7 2.6*   GFR: Estimated Creatinine Clearance: 42.4 mL/min (A) (by C-G formula based on SCr of 1.23 mg/dL (H)). Liver Function Tests:  Recent Labs Lab 05/13/17 0945  AST 45*  ALT 32  ALKPHOS 75  BILITOT 0.9  PROT 6.6  ALBUMIN  2.4*    Recent Labs Lab 05/13/17 0945  LIPASE 33   No results for input(s): AMMONIA in the last 168 hours. Coagulation Profile:  Recent Labs Lab 05/13/17 1424  INR 1.15   Cardiac Enzymes: No results for input(s): CKTOTAL, CKMB, CKMBINDEX, TROPONINI in the last 168 hours. BNP (last 3 results) No results for input(s): PROBNP in the last 8760 hours. HbA1C:  Recent Labs  05/13/17 1424  HGBA1C 6.4*   CBG:  Recent Labs Lab 05/13/17 1742 05/13/17 2028 05/14/17 0740  GLUCAP 151* 166* 145*   Lipid Profile: No results for input(s): CHOL, HDL, LDLCALC, TRIG, CHOLHDL, LDLDIRECT in the last 72 hours. Thyroid Function Tests:  Recent Labs  05/13/17 1424  TSH 0.410   Anemia Panel: No results for input(s): VITAMINB12, FOLATE, FERRITIN, TIBC, IRON, RETICCTPCT in the last 72 hours. Sepsis Labs: No results for input(s): PROCALCITON, LATICACIDVEN in the last 168 hours.  Recent Results (from the past 240 hour(s))  C difficile quick scan w PCR reflex     Status: Abnormal   Collection Time: 05/13/17  3:23 PM  Result Value Ref Range Status   C Diff antigen POSITIVE (A) NEGATIVE Final   C Diff toxin POSITIVE (A) NEGATIVE Final   C Diff interpretation Toxin producing C. difficile detected.  Final    Comment: CRITICAL RESULT CALLED TO, READ BACK BY AND VERIFIED WITH: D.BLOCK RN AT 1828 ON 05/13/17 BY S.VANHOORNE          Radiology Studies: Ct Abdomen Pelvis Wo Contrast  Result Date: 05/13/2017 CLINICAL DATA:  Abdominal pain and diarrhea EXAM: CT ABDOMEN AND PELVIS WITHOUT CONTRAST TECHNIQUE: Multidetector CT imaging of the abdomen and pelvis was performed following the standard protocol without IV contrast. COMPARISON:  None. FINDINGS: Lower chest: Lung bases are free of acute infiltrate or sizable effusion. Tiny 2 mm nodule is noted along the major fissure on the right best seen on image number 6 of series 3. Hepatobiliary: No focal liver abnormality is seen. No gallstones,  gallbladder wall thickening, or biliary dilatation. Pancreas: Unremarkable. No pancreatic ductal dilatation or surrounding inflammatory changes. Spleen: Normal in size without focal abnormality. Adrenals/Urinary Tract: Adrenal glands are within normal limits. Left kidney demonstrates multiple hypodensities likely represent cysts. No obstructive changes are seen. The right kidney is considerably smaller than the left with a 5 mm calculus in the lower pole. No obstructive changes are seen. The bladder is decompressed. Stomach/Bowel: Scattered diverticular change of the colon is noted. Some wall thickening and pericolonic inflammatory changes are noted in the ascending colon and proximal transverse colon consistent with a focal colitis. The appendix is within normal limits. No obstructive changes are seen. Small sliding-type hiatal hernia is noted. Vascular/Lymphatic: Aortic atherosclerosis. No enlarged abdominal or pelvic lymph nodes. Reproductive: Uterus and bilateral adnexa are unremarkable. Other: No abdominal wall hernia or abnormality. No abdominopelvic ascites. Musculoskeletal: Degenerative change of the lumbar spine is seen. IMPRESSION: Tiny 2 mm  nodule in the right lower lobe. No follow-up needed if patient is low-risk. Non-contrast chest CT can be considered in 12 months if patient is high-risk. This recommendation follows the consensus statement: Guidelines for Management of Incidental Pulmonary Nodules Detected on CT Images: From the Fleischner Society 2017; Radiology 2017; 284:228-243. Left renal cysts and small right renal stone without obstruction. Diverticulosis without diverticulitis. Diffuse colitis involving the ascending colon and proximal transverse colon. No abscess or perforation is seen. Electronically Signed   By: Alcide CleverMark  Lukens M.D.   On: 05/13/2017 11:42   Dg Chest 2 View  Result Date: 05/13/2017 CLINICAL DATA:  76 year old female with recent norovirus illness. New cough. Ongoing nausea  vomiting diarrhea. EXAM: CHEST  2 VIEW COMPARISON:  CT Abdomen and Pelvis 1124 hours today. FINDINGS: Semi upright AP and lateral views of the chest. Low lung volumes. Mild cardiomegaly. Calcified aortic atherosclerosis. No pneumothorax, pulmonary edema, pleural effusion or pneumoperitoneum. Mild left lung base atelectasis. No other abnormal pulmonary opacity. Flowing osteophytes in the spine. IMPRESSION: 1. Low lung volumes with mild atelectasis. No acute cardiopulmonary abnormality. 2.  Calcified aortic atherosclerosis. Electronically Signed   By: Odessa FlemingH  Hall M.D.   On: 05/13/2017 12:07        Scheduled Meds: . aspirin EC  81 mg Oral Daily  . atenolol  25 mg Oral Daily  . chlorhexidine  15 mL Mouth Rinse BID  . clopidogrel  75 mg Oral Daily  . escitalopram  10 mg Oral Daily  . heparin  5,000 Units Subcutaneous Q8H  . insulin aspart  0-9 Units Subcutaneous TID WC  . mouth rinse  15 mL Mouth Rinse q12n4p  . saccharomyces boulardii  250 mg Oral BID  . vancomycin  125 mg Oral Q6H   Continuous Infusions: . sodium chloride 75 mL/hr at 05/14/17 0732     LOS: 1 day        Glade LloydKshitiz Navia Lindahl, MD [facility] Pager [phone]  If 7PM-7AM, please contact night-coverage [email or web address] Password [email or web address] 05/14/2017, 12:01 PM

## 2017-05-14 NOTE — Progress Notes (Signed)
Lab called with results of gi pathogen panel.  Patient positive for enteropathogenic e coli.  Dr. Hanley BenAlekh was text paged the results.

## 2017-05-14 NOTE — Care Management Note (Signed)
Case Management Note  Patient Details  Name: Shirley Collins MRN: [id number] Date of Birth: 09/06/1941  Subjective/Objective:                  76 y.o. female with medical history significant of HTN and diabetes mellitus type 2 given to the hospital because of diarrhea and generalized weakness. Patient is from [place], she went to her primary care physician 5 days prior to admission for nausea, vomiting, abdominal pain and diarrhea. She was told she probably has Norovirus, and let it take its course. [place] brought her here because she was getting weaker and weaker. Reported her nausea and vomiting resolved but the diarrhea got worse to the point she became incontinent this morning, became extremely weak so she came to the hospital for further evaluation. In the ED initial evaluation showed potassium of 2.4 and CT scan of abdomen/pelvis showed diffuse colitis.  Action/Plan: Date:  May 14, 2017 Chart reviewed for concurrent status and case management needs. Will continue to follow patient progress. Discharge Planning: following for needs Expected discharge date: [id number] Marcelle SmilingRhonda Davis, BSN, [place], [place]   [phone]  Expected Discharge Date:   (unknown)               Expected Discharge Plan:  Home/Self Care  In-House Referral:     Discharge planning Services  CM Consult  Post Acute Care Choice:    Choice offered to:     DME Arranged:    DME Agency:     HH Arranged:    HH Agency:     Status of Service:  In process, will continue to follow  If discussed at Long Length of Stay Meetings, dates discussed:    Additional Comments:  Golda AcreDavis, Rhonda Lynn, RN 05/14/2017, 9:52 AM

## 2017-05-15 ENCOUNTER — Inpatient Hospital Stay (HOSPITAL_COMMUNITY): Payer: Medicare (Managed Care)

## 2017-05-15 DIAGNOSIS — M79609 Pain in unspecified limb: Secondary | ICD-10-CM

## 2017-05-15 DIAGNOSIS — M7989 Other specified soft tissue disorders: Secondary | ICD-10-CM

## 2017-05-15 LAB — GLUCOSE, CAPILLARY
GLUCOSE-CAPILLARY: 92 mg/dL (ref 65–99)
GLUCOSE-CAPILLARY: 122 mg/dL — AB (ref 65–99)
Glucose-Capillary: 121 mg/dL — ABNORMAL HIGH (ref 65–99)
Glucose-Capillary: 136 mg/dL — ABNORMAL HIGH (ref 65–99)

## 2017-05-15 LAB — MAGNESIUM: Magnesium: 2.2 mg/dL (ref 1.7–2.4)

## 2017-05-15 LAB — CBC
HEMATOCRIT: 31.5 % — AB (ref 36.0–46.0)
Hemoglobin: 10.8 g/dL — ABNORMAL LOW (ref 12.0–15.0)
MCH: 21.9 pg — ABNORMAL LOW (ref 26.0–34.0)
MCHC: 34.3 g/dL (ref 30.0–36.0)
MCV: 63.9 fL — AB (ref 78.0–100.0)
Platelets: 543 10*3/uL — ABNORMAL HIGH (ref 150–400)
RBC: 4.93 MIL/uL (ref 3.87–5.11)
RDW: 15.4 % (ref 11.5–15.5)
WBC: 12.5 10*3/uL — AB (ref 4.0–10.5)

## 2017-05-15 LAB — BASIC METABOLIC PANEL
ANION GAP: 8 (ref 5–15)
BUN: 20 mg/dL (ref 6–20)
CHLORIDE: 105 mmol/L (ref 101–111)
CO2: 22 mmol/L (ref 22–32)
Calcium: 8.3 mg/dL — ABNORMAL LOW (ref 8.9–10.3)
Creatinine, Ser: 0.86 mg/dL (ref 0.44–1.00)
GFR calc Af Amer: >60 mL/min (ref >60)
Glucose, Bld: 131 mg/dL — ABNORMAL HIGH (ref 65–99)
POTASSIUM: 3.6 mmol/L (ref 3.5–5.1)
SODIUM: 135 mmol/L (ref 135–145)

## 2017-05-15 MED ORDER — ENOXAPARIN SODIUM 100 MG/ML ~~LOC~~ SOLN
1.0000 mg/kg | Freq: Once | SUBCUTANEOUS | Status: AC
  Administered 2017-05-15: 90 mg via SUBCUTANEOUS
  Filled 2017-05-15: qty 1

## 2017-05-15 MED ORDER — POTASSIUM CHLORIDE CRYS ER 20 MEQ PO TBCR
40.0000 meq | EXTENDED_RELEASE_TABLET | Freq: Once | ORAL | Status: AC
  Administered 2017-05-15: 40 meq via ORAL
  Filled 2017-05-15: qty 2

## 2017-05-15 MED ORDER — SODIUM CHLORIDE 0.9 % IV SOLN
INTRAVENOUS | Status: DC
  Administered 2017-05-16 – 2017-05-19 (×4): via INTRAVENOUS
  Administered 2017-05-20: 1000 mL via INTRAVENOUS

## 2017-05-15 MED ORDER — AZITHROMYCIN 250 MG PO TABS
500.0000 mg | ORAL_TABLET | Freq: Every day | ORAL | Status: DC
  Administered 2017-05-15: 500 mg via ORAL
  Filled 2017-05-15: qty 2

## 2017-05-15 MED ORDER — ENOXAPARIN SODIUM 100 MG/ML ~~LOC~~ SOLN
1.0000 mg/kg | Freq: Two times a day (BID) | SUBCUTANEOUS | Status: DC
  Administered 2017-05-16: 90 mg via SUBCUTANEOUS
  Filled 2017-05-15: qty 1

## 2017-05-15 MED ORDER — LORAZEPAM 2 MG/ML IJ SOLN
0.5000 mg | INTRAMUSCULAR | Status: AC | PRN
  Administered 2017-05-15 – 2017-05-16 (×2): 0.5 mg via INTRAVENOUS
  Filled 2017-05-15 (×2): qty 1

## 2017-05-15 MED ORDER — TRAMADOL HCL 50 MG PO TABS
50.0000 mg | ORAL_TABLET | Freq: Four times a day (QID) | ORAL | Status: DC | PRN
  Administered 2017-05-15 – 2017-05-20 (×4): 50 mg via ORAL
  Filled 2017-05-15 (×4): qty 1

## 2017-05-15 NOTE — Progress Notes (Signed)
ANTICOAGULATION CONSULT NOTE - Initial Consult  Pharmacy Consult for enoxaparin Indication: VTE treatment  No Known Allergies  Patient Measurements: Height: 5\' 4"  (162.6 cm) Weight: 200 lb (90.7 kg) IBW/kg (Calculated) : 54.7   Vital Signs: Temp: 98.1 F (36.7 C) (06/22 1442) Temp Source: Oral (06/22 1442) BP: 135/55 (06/22 1442) Pulse Rate: 60 (06/22 1442)  Labs:  Recent Labs  05/13/17 0945 05/13/17 1424 05/14/17 0735 05/15/17 0720  HGB 12.7  --  10.7* 10.8*  HCT 36.1  --  31.0* 31.5*  PLT 500*  --  508* 543*  LABPROT  --  14.8  --   --   INR  --  1.15  --   --   CREATININE 1.48*  --  1.23* 0.86    Estimated Creatinine Clearance: 60.7 mL/min (by C-G formula based on SCr of 0.86 mg/dL).   Medical History: Past Medical History:  Diagnosis Date  . Anemia   . Anxiety   . Arthritis   . Asthma   . CHF (congestive heart failure) (HCC)   . Chronic kidney disease   . Coronary artery disease   . Dementia   . Depression   . Diabetes mellitus without complication (HCC)   . GERD (gastroesophageal reflux disease)   . Hypertension   . Pneumonia   . Stroke [facility](HCC)      Assessment: [age] year female with history of hypertension and diabetes mellitus type 2 presented to the hospital with complaints of diarrhea and generalized weakness progressively getting worse.   Pharmacy consulted to dose enoxaparin for VTE treatment.  05/15/2017 Last dose of SQ heparin 0507 this morning Scr 0.86, CrCl ~ 6860mls/min Plts 543, INR 1.15 H/H low  Goal of Therapy:  Anti-Xa level 0.6-1 units/ml 4hrs after LMWH dose given Monitor platelets by anticoagulation protocol   Plan:  Enoxaparin 1mg /kg (90mg ) sq q12h Follow CBC q72 hours Monitor for bleeding  Arley Phenixllen Kassadee Carawan RPh 05/15/2017, 3:29 PM Pager [phone]

## 2017-05-15 NOTE — Progress Notes (Signed)
**  Preliminary report by tech**  Bilateral lower extremity venous duplex complete. There is evidence of acute deep vein thrombosis involving the peroneal veins of the left lower extremity. There is no evidence of superficial vein thrombosis involving the left lower extremity. There is no evidence of deep or superficial vein thrombosis involving the right lower extremity. There is no evidence of a Baker's cyst bilaterally. Results were given to Dr. Hanley BenAlekh.  05/15/17 3:06 PM Olen CordialGreg Sarha Bartelt RVT

## 2017-05-15 NOTE — Progress Notes (Addendum)
PT Cancellation Note  Patient Details Name: Shirley CreedClara Collins MRN: [id number] DOB: 02/09/1941   Cancelled Treatment:    Reason Eval/Treat Not Completed: Patient at procedure or test/unavailable. Will check back as schedule allows.   Addendum: (+) acute DVT L LE per chart. Will hold PT and check back another day.    Rebeca AlertJannie Rashan Rounsaville, MPT Pager: [phone]

## 2017-05-15 NOTE — Progress Notes (Signed)
Patient ID: Shirley Collins, female   DOB: 02/16/1941, 76 y.o.   MRN: [id number]  PROGRESS NOTE    Shirley Collins  WUJ:[id number] DOB: 09/02/1941 DOA: 05/13/2017 PCP: Brenton GrillsMercier, Randall R   Brief Narrative:  [age] year female with history of hypertension and diabetes mellitus type 2 presented to the hospital with complaints of diarrhea and generalized weakness progressively getting worse. She was found to have diffuse colitis on CAT scan of the abdomen and pelvis and C. difficile was found to be positive. She was switched to oral vancomycin after initial ciprofloxacin and Flagyl intravenous treatment. She still having a lot of diarrhea.   Assessment & Plan:   Principal Problem:   Colitis Active Problems:   Hyponatremia   Hypokalemia   Type 2 diabetes mellitus without complication (HCC)   AKI (acute kidney injury) (HCC)   Dehydration    Probable acute C. difficile Colitis -Continue oral vancomycin for total of 10-14 days. This is her first episode of C. difficile colitis. Stool PCR is positive for enteropathogenic Escherichia coli. Add Zithromax 500 mg daily for 3 days. Monitor off PPI -Patient is still having a lot of diarrhea   Acute kidney injury -Renal function baseline unknown, creatinine was 1.4 on presentation, presumed to be acute kidney injury. Creatinine has improved 0.86 this morning. Discontinue IV fluids. Repeat a.m. BMP  Hypokalemia  - Probably secondary to diarrhea -Improving. -Check BMP in a.m. including magnesium.  Hyponatremia -Improved, likely secondary to dehydration and diarrhea  Diabetes mellitus type 2 with hyperglycemia -Hold oral hypoglycemic agents; SSI  HTN -Hold diuretics, continue atenolol to avoid rebound tachycardia. Blood pressure controlled  Leukocytosis Probably secondary to C. difficile colitis. Improving. Repeat a.m. Labs  Thrombocytosis Probably reactive. Repeat a.m. labs  DVT prophylaxis: Subcutaneous  heparin Code Status:  Full  code Family Communication: Discussed with daughter present at bedside Disposition Plan: Home in 1-2 days  Consultants: None  Procedures: None  Antimicrobials: Oral vancomycin from 05/13/2017 Ciprofloxacin and Flagyl on 05/13/2017 and discontinued on the same day  Subjective: Patient seen and examined at bedside. She is still having a lot of diarrhea. No overnight fever, vomiting. She is tolerating her diet but her appetite is still poor.  Objective: Vitals:   05/14/17 0606 05/14/17 0820 05/14/17 2141 05/15/17 0511  BP: (!) 121/58 123/73 (!) 132/43 140/72  Pulse: 76 65 70 73  Resp: 20  18 20   Temp: 98.5 F (36.9 C)  98.6 F (37 C) 97.9 F (36.6 C)  TempSrc: Oral  Oral Oral  SpO2: 97%  99% 96%  Weight:      Height:        Intake/Output Summary (Last 24 hours) at 05/15/17 1028 Last data filed at 05/15/17 0532  Gross per 24 hour  Intake          1243.75 ml  Output              200 ml  Net          1043.75 ml   Filed Weights   05/13/17 1600  Weight: 90.7 kg (200 lb)    Examination:  General exam: Appears calm and comfortable  Respiratory system: Bilateral decreased breath sound at bases Cardiovascular system: S1 & S2 heard, Rate controlled  Gastrointestinal system: Abdomen is nondistended, soft and nontender. Normal bowel sounds heard. Extremities: No cyanosis, clubbing, edema     Data Reviewed: I have personally reviewed following labs and imaging studies  CBC:  Recent Labs Lab 05/13/17 0945 05/14/17 0735  05/15/17 0720  WBC 20.3* 18.5* 12.5*  HGB 12.7 10.7* 10.8*  HCT 36.1 31.0* 31.5*  MCV 63.8* 63.8* 63.9*  PLT 500* 508* 543*   Basic Metabolic Panel:  Recent Labs Lab 05/13/17 0945 05/14/17 0735 05/15/17 0720  NA 129* 136 135  K 2.4* 3.4* 3.6  CL 90* 102 105  CO2 25 24 22   GLUCOSE 276* 140* 131*  BUN 23* 25* 20  CREATININE 1.48* 1.23* 0.86  CALCIUM 8.3* 8.3* 8.3*  MG 1.7 2.6* 2.2   GFR: Estimated Creatinine Clearance: 60.7 mL/min (by  C-G formula based on SCr of 0.86 mg/dL). Liver Function Tests:  Recent Labs Lab 05/13/17 0945  AST 45*  ALT 32  ALKPHOS 75  BILITOT 0.9  PROT 6.6  ALBUMIN 2.4*    Recent Labs Lab 05/13/17 0945  LIPASE 33   No results for input(s): AMMONIA in the last 168 hours. Coagulation Profile:  Recent Labs Lab 05/13/17 1424  INR 1.15   Cardiac Enzymes: No results for input(s): CKTOTAL, CKMB, CKMBINDEX, TROPONINI in the last 168 hours. BNP (last 3 results) No results for input(s): PROBNP in the last 8760 hours. HbA1C:  Recent Labs  05/13/17 1424  HGBA1C 6.4*   CBG:  Recent Labs Lab 05/14/17 0740 05/14/17 1204 05/14/17 1724 05/14/17 2148 05/15/17 0800  GLUCAP 145* 137* 172* 132* 121*   Lipid Profile: No results for input(s): CHOL, HDL, LDLCALC, TRIG, CHOLHDL, LDLDIRECT in the last 72 hours. Thyroid Function Tests:  Recent Labs  05/13/17 1424  TSH 0.410   Anemia Panel: No results for input(s): VITAMINB12, FOLATE, FERRITIN, TIBC, IRON, RETICCTPCT in the last 72 hours. Sepsis Labs: No results for input(s): PROCALCITON, LATICACIDVEN in the last 168 hours.  Recent Results (from the past 240 hour(s))  C difficile quick scan w PCR reflex     Status: Abnormal   Collection Time: 05/13/17  3:23 PM  Result Value Ref Range Status   C Diff antigen POSITIVE (A) NEGATIVE Final   C Diff toxin POSITIVE (A) NEGATIVE Final   C Diff interpretation Toxin producing C. difficile detected.  Final    Comment: CRITICAL RESULT CALLED TO, READ BACK BY AND VERIFIED WITH: D.BLOCK RN AT 1828 ON 05/13/17 BY S.VANHOORNE   Gastrointestinal Panel by PCR , Stool     Status: Abnormal   Collection Time: 05/13/17  3:23 PM  Result Value Ref Range Status   Campylobacter species NOT DETECTED NOT DETECTED Final   Plesimonas shigelloides NOT DETECTED NOT DETECTED Final   Salmonella species NOT DETECTED NOT DETECTED Final   Yersinia enterocolitica NOT DETECTED NOT DETECTED Final   Vibrio species  NOT DETECTED NOT DETECTED Final   Vibrio cholerae NOT DETECTED NOT DETECTED Final   Enteroaggregative E coli (EAEC) NOT DETECTED NOT DETECTED Final   Enteropathogenic E coli (EPEC) DETECTED (A) NOT DETECTED Final    Comment: RESULT CALLED TO, READ BACK BY AND VERIFIED WITH: DAVID BLOCK AT 1409 ON 05/14/2017 JJB    Enterotoxigenic E coli (ETEC) NOT DETECTED NOT DETECTED Final   Shiga like toxin producing E coli (STEC) NOT DETECTED NOT DETECTED Final   Shigella/Enteroinvasive E coli (EIEC) NOT DETECTED NOT DETECTED Final   Cryptosporidium NOT DETECTED NOT DETECTED Final   Cyclospora cayetanensis NOT DETECTED NOT DETECTED Final   Entamoeba histolytica NOT DETECTED NOT DETECTED Final   Giardia lamblia NOT DETECTED NOT DETECTED Final   Adenovirus F40/41 NOT DETECTED NOT DETECTED Final   Astrovirus NOT DETECTED NOT DETECTED Final   Norovirus GI/GII  NOT DETECTED NOT DETECTED Final   Rotavirus A NOT DETECTED NOT DETECTED Final   Sapovirus (I, II, IV, and V) NOT DETECTED NOT DETECTED Final         Radiology Studies: Ct Abdomen Pelvis Wo Contrast  Result Date: 05/13/2017 CLINICAL DATA:  Abdominal pain and diarrhea EXAM: CT ABDOMEN AND PELVIS WITHOUT CONTRAST TECHNIQUE: Multidetector CT imaging of the abdomen and pelvis was performed following the standard protocol without IV contrast. COMPARISON:  None. FINDINGS: Lower chest: Lung bases are free of acute infiltrate or sizable effusion. Tiny 2 mm nodule is noted along the major fissure on the right best seen on image number 6 of series 3. Hepatobiliary: No focal liver abnormality is seen. No gallstones, gallbladder wall thickening, or biliary dilatation. Pancreas: Unremarkable. No pancreatic ductal dilatation or surrounding inflammatory changes. Spleen: Normal in size without focal abnormality. Adrenals/Urinary Tract: Adrenal glands are within normal limits. Left kidney demonstrates multiple hypodensities likely represent cysts. No obstructive  changes are seen. The right kidney is considerably smaller than the left with a 5 mm calculus in the lower pole. No obstructive changes are seen. The bladder is decompressed. Stomach/Bowel: Scattered diverticular change of the colon is noted. Some wall thickening and pericolonic inflammatory changes are noted in the ascending colon and proximal transverse colon consistent with a focal colitis. The appendix is within normal limits. No obstructive changes are seen. Small sliding-type hiatal hernia is noted. Vascular/Lymphatic: Aortic atherosclerosis. No enlarged abdominal or pelvic lymph nodes. Reproductive: Uterus and bilateral adnexa are unremarkable. Other: No abdominal wall hernia or abnormality. No abdominopelvic ascites. Musculoskeletal: Degenerative change of the lumbar spine is seen. IMPRESSION: Tiny 2 mm nodule in the right lower lobe. No follow-up needed if patient is low-risk. Non-contrast chest CT can be considered in 12 months if patient is high-risk. This recommendation follows the consensus statement: Guidelines for Management of Incidental Pulmonary Nodules Detected on CT Images: From the Fleischner Society 2017; Radiology 2017; 284:228-243. Left renal cysts and small right renal stone without obstruction. Diverticulosis without diverticulitis. Diffuse colitis involving the ascending colon and proximal transverse colon. No abscess or perforation is seen. Electronically Signed   By: Alcide Clever M.D.   On: 05/13/2017 11:42   Dg Chest 2 View  Result Date: 05/13/2017 CLINICAL DATA:  76 year old female with recent norovirus illness. New cough. Ongoing nausea vomiting diarrhea. EXAM: CHEST  2 VIEW COMPARISON:  CT Abdomen and Pelvis 1124 hours today. FINDINGS: Semi upright AP and lateral views of the chest. Low lung volumes. Mild cardiomegaly. Calcified aortic atherosclerosis. No pneumothorax, pulmonary edema, pleural effusion or pneumoperitoneum. Mild left lung base atelectasis. No other abnormal  pulmonary opacity. Flowing osteophytes in the spine. IMPRESSION: 1. Low lung volumes with mild atelectasis. No acute cardiopulmonary abnormality. 2.  Calcified aortic atherosclerosis. Electronically Signed   By: Odessa Fleming M.D.   On: 05/13/2017 12:07        Scheduled Meds: . aspirin EC  81 mg Oral Daily  . atenolol  25 mg Oral Daily  . azithromycin  500 mg Oral Daily  . chlorhexidine  15 mL Mouth Rinse BID  . clopidogrel  75 mg Oral Daily  . escitalopram  10 mg Oral Daily  . heparin  5,000 Units Subcutaneous Q8H  . insulin aspart  0-9 Units Subcutaneous TID WC  . mouth rinse  15 mL Mouth Rinse q12n4p  . saccharomyces boulardii  250 mg Oral BID  . vancomycin  125 mg Oral Q6H   Continuous Infusions:  LOS: 2 days        Glade Lloyd, MD [facility] Pager [phone]  If 7PM-7AM, please contact night-coverage [email or web address] Password [facility] 05/15/2017, 10:28 AM

## 2017-05-16 LAB — BASIC METABOLIC PANEL
Anion gap: 10 (ref 5–15)
BUN: 13 mg/dL (ref 6–20)
CALCIUM: 8.1 mg/dL — AB (ref 8.9–10.3)
CO2: 21 mmol/L — ABNORMAL LOW (ref 22–32)
CREATININE: 0.76 mg/dL (ref 0.44–1.00)
Chloride: 106 mmol/L (ref 101–111)
GFR calc non Af Amer: >60 mL/min (ref >60)
Glucose, Bld: 119 mg/dL — ABNORMAL HIGH (ref 65–99)
Potassium: 4 mmol/L (ref 3.5–5.1)
SODIUM: 137 mmol/L (ref 135–145)

## 2017-05-16 LAB — CBC
HCT: 31.7 % — ABNORMAL LOW (ref 36.0–46.0)
Hemoglobin: 10.6 g/dL — ABNORMAL LOW (ref 12.0–15.0)
MCH: 21.5 pg — ABNORMAL LOW (ref 26.0–34.0)
MCHC: 33.4 g/dL (ref 30.0–36.0)
MCV: 64.4 fL — ABNORMAL LOW (ref 78.0–100.0)
PLATELETS: 588 10*3/uL — AB (ref 150–400)
RBC: 4.92 MIL/uL (ref 3.87–5.11)
RDW: 15.6 % — ABNORMAL HIGH (ref 11.5–15.5)
WBC: 11.7 10*3/uL — AB (ref 4.0–10.5)

## 2017-05-16 LAB — GLUCOSE, CAPILLARY
GLUCOSE-CAPILLARY: 108 mg/dL — AB (ref 65–99)
Glucose-Capillary: 116 mg/dL — ABNORMAL HIGH (ref 65–99)
Glucose-Capillary: 137 mg/dL — ABNORMAL HIGH (ref 65–99)
Glucose-Capillary: 137 mg/dL — ABNORMAL HIGH (ref 65–99)

## 2017-05-16 LAB — MAGNESIUM: MAGNESIUM: 1.7 mg/dL (ref 1.7–2.4)

## 2017-05-16 MED ORDER — DEXTROSE 5 % IV SOLN
1.0000 g | INTRAVENOUS | Status: DC
  Administered 2017-05-16 – 2017-05-18 (×3): 1 g via INTRAVENOUS
  Filled 2017-05-16 (×3): qty 10

## 2017-05-16 MED ORDER — RIVAROXABAN (XARELTO) EDUCATION KIT FOR DVT/PE PATIENTS
PACK | Freq: Once | Status: DC
  Filled 2017-05-16: qty 1

## 2017-05-16 MED ORDER — RIVAROXABAN 15 MG PO TABS
15.0000 mg | ORAL_TABLET | Freq: Two times a day (BID) | ORAL | Status: DC
  Administered 2017-05-16: 15 mg via ORAL
  Filled 2017-05-16: qty 1

## 2017-05-16 MED ORDER — RIVAROXABAN 20 MG PO TABS: 20.0000 mg | ORAL_TABLET | Freq: Every day | ORAL | Status: DC

## 2017-05-16 NOTE — Progress Notes (Signed)
Patient ID: Shirley CreedClara Douthit, female   DOB: 04/03/1941, 76 y.o.   MRN: [id number]  PROGRESS NOTE    Shirley Collins  WUJ:[id number] DOB: 11/09/1941 DOA: 05/13/2017 PCP: Brenton GrillsMercier, Randall R   Brief Narrative:  [age] year female with history of hypertension and diabetes mellitus type 2 presented to the hospital with complaints of diarrhea and generalized weakness progressively getting worse. She was found to have diffuse colitis on CAT scan of the abdomen and pelvis and C. difficile was found to be positive. She was switched to oral vancomycin after initial ciprofloxacin and Flagyl intravenous treatment. She is still having a lot of diarrhea. She was found to have left lower extremity DVT for which she started on therapeutic Lovenox.   Assessment & Plan:   Principal Problem:   Colitis Active Problems:   Hyponatremia   Hypokalemia   Type 2 diabetes mellitus without complication (HCC)   AKI (acute kidney injury) (HCC)   Dehydration  Probable acute C. difficile Colitis - Still having significant diarrhea. - Continue oral vancomycin for total of 10-14 days. This is her first episode of C. difficile colitis. Stool PCR is positive for enteropathogenic Escherichia coli. Discontinue Zithromax, start Rocephin 1 g IV daily  Acute Left peroneal vein DVT Was started on Lovenox therapeutic dosing on 05/15/2017. We will switch to oral Xarelto after discussion with the patient's daughter  Acute kidney injury -Resolved  Hypokalemia  - Probably secondary to diarrhea -Resolved  Hyponatremia - Resolved; likely secondary to dehydration and diarrhea  Diabetes mellitus type 2 with hyperglycemia -Hold oral hypoglycemic agents; SSI  HTN -Hold diuretics, continue atenolol to avoid rebound tachycardia. Blood pressure controlled  Leukocytosis Probably secondary to C. difficile colitis. Improving. Repeat a.m. Labs  Thrombocytosis Probably reactive. Repeat a.m. labs  DVT prophylaxis:Subcutaneous  heparin Code Status:Full code Family Communication:Discussed with daughter present at bedside Disposition Plan:Home in 1-2 days once diarrhea improves  Consultants:None  Procedures: Bilateral lower extremity duplex ultrasound on 05/15/2017: Summary: There is evidence of acute deep vein thrombosis involving the peroneal veins of the left lower extremity.  There is no evidence of superficial vein thrombosis involving the left lower extremity. There is no evidence of deep or superficial vein thrombosis involving the right lower extremity. There is no evidence of a Baker&'s cyst bilaterally.  Antimicrobials: Oral vancomycin from 05/13/2017 Ciprofloxacin and Flagyl on 05/13/2017 and discontinued on the same day Zithromax from 05/15/2017  Subjective: Patient seen and examined at bedside. She denies any abdominal pain, nausea, vomiting. She still having a lot of diarrhea.  Objective: Vitals:   05/15/17 0511 05/15/17 1442 05/16/17 0506 05/16/17 1044  BP: 140/72 130/60 140/86 (!) 162/56  Pulse: 73 71 75 74  Resp: 20 18 18    Temp: 97.9 F (36.6 C) 97.6 F (36.4 C) 98.7 F (37.1 C)   TempSrc: Oral Oral Oral   SpO2: 96% 98% 98%   Weight:      Height:        Intake/Output Summary (Last 24 hours) at 05/16/17 1243 Last data filed at 05/15/17 1730  Gross per 24 hour  Intake              480 ml  Output                0 ml  Net              480 ml   Filed Weights   05/13/17 1600  Weight: 90.7 kg (200 lb)    Examination:  General  exam: Appears calm and comfortable  Respiratory system: Bilateral decreased breath sound at bases With scattered crackles Cardiovascular system: S1 & S2 heard, rate controlled  Gastrointestinal system: Abdomen is nondistended, soft and nontender. Normal bowel sounds heard. Central nervous system: Alert and oriented. No focal neurological deficits. Moving extremities Extremities: No cyanosis, clubbing, edema  Skin: No rashes, lesions or  ulcers Lymph: No cervical lymphadenopathy    Data Reviewed: I have personally reviewed following labs and imaging studies  CBC:  Recent Labs Lab 05/13/17 0945 05/14/17 0735 05/15/17 0720 05/16/17 0631  WBC 20.3* 18.5* 12.5* 11.7*  HGB 12.7 10.7* 10.8* 10.6*  HCT 36.1 31.0* 31.5* 31.7*  MCV 63.8* 63.8* 63.9* 64.4*  PLT 500* 508* 543* 588*   Basic Metabolic Panel:  Recent Labs Lab 05/13/17 0945 05/14/17 0735 05/15/17 0720 05/16/17 0631  NA 129* 136 135 137  K 2.4* 3.4* 3.6 4.0  CL 90* 102 105 106  CO2 25 24 22  21*  GLUCOSE 276* 140* 131* 119*  BUN 23* 25* 20 13  CREATININE 1.48* 1.23* 0.86 0.76  CALCIUM 8.3* 8.3* 8.3* 8.1*  MG 1.7 2.6* 2.2 1.7   GFR: Estimated Creatinine Clearance: 65.3 mL/min (by C-G formula based on SCr of 0.76 mg/dL). Liver Function Tests:  Recent Labs Lab 05/13/17 0945  AST 45*  ALT 32  ALKPHOS 75  BILITOT 0.9  PROT 6.6  ALBUMIN 2.4*    Recent Labs Lab 05/13/17 0945  LIPASE 33   No results for input(s): AMMONIA in the last 168 hours. Coagulation Profile:  Recent Labs Lab 05/13/17 1424  INR 1.15   Cardiac Enzymes: No results for input(s): CKTOTAL, CKMB, CKMBINDEX, TROPONINI in the last 168 hours. BNP (last 3 results) No results for input(s): PROBNP in the last 8760 hours. HbA1C:  Recent Labs  05/13/17 1424  HGBA1C 6.4*   CBG:  Recent Labs Lab 05/15/17 1215 05/15/17 1732 05/15/17 2048 05/16/17 0741 05/16/17 1154  GLUCAP 136* 92 122* 116* 137*   Lipid Profile: No results for input(s): CHOL, HDL, LDLCALC, TRIG, CHOLHDL, LDLDIRECT in the last 72 hours. Thyroid Function Tests:  Recent Labs  05/13/17 1424  TSH 0.410   Anemia Panel: No results for input(s): VITAMINB12, FOLATE, FERRITIN, TIBC, IRON, RETICCTPCT in the last 72 hours. Sepsis Labs: No results for input(s): PROCALCITON, LATICACIDVEN in the last 168 hours.  Recent Results (from the past 240 hour(s))  C difficile quick scan w PCR reflex      Status: Abnormal   Collection Time: 05/13/17  3:23 PM  Result Value Ref Range Status   C Diff antigen POSITIVE (A) NEGATIVE Final   C Diff toxin POSITIVE (A) NEGATIVE Final   C Diff interpretation Toxin producing C. difficile detected.  Final    Comment: CRITICAL RESULT CALLED TO, READ BACK BY AND VERIFIED WITH: D.BLOCK RN AT 1828 ON 05/13/17 BY S.VANHOORNE   Gastrointestinal Panel by PCR , Stool     Status: Abnormal   Collection Time: 05/13/17  3:23 PM  Result Value Ref Range Status   Campylobacter species NOT DETECTED NOT DETECTED Final   Plesimonas shigelloides NOT DETECTED NOT DETECTED Final   Salmonella species NOT DETECTED NOT DETECTED Final   Yersinia enterocolitica NOT DETECTED NOT DETECTED Final   Vibrio species NOT DETECTED NOT DETECTED Final   Vibrio cholerae NOT DETECTED NOT DETECTED Final   Enteroaggregative E coli (EAEC) NOT DETECTED NOT DETECTED Final   Enteropathogenic E coli (EPEC) DETECTED (A) NOT DETECTED Final    Comment: RESULT  CALLED TO, READ BACK BY AND VERIFIED WITH: DAVID BLOCK AT 1409 ON 05/14/2017 JJB    Enterotoxigenic E coli (ETEC) NOT DETECTED NOT DETECTED Final   Shiga like toxin producing E coli (STEC) NOT DETECTED NOT DETECTED Final   Shigella/Enteroinvasive E coli (EIEC) NOT DETECTED NOT DETECTED Final   Cryptosporidium NOT DETECTED NOT DETECTED Final   Cyclospora cayetanensis NOT DETECTED NOT DETECTED Final   Entamoeba histolytica NOT DETECTED NOT DETECTED Final   Giardia lamblia NOT DETECTED NOT DETECTED Final   Adenovirus F40/41 NOT DETECTED NOT DETECTED Final   Astrovirus NOT DETECTED NOT DETECTED Final   Norovirus GI/GII NOT DETECTED NOT DETECTED Final   Rotavirus A NOT DETECTED NOT DETECTED Final   Sapovirus (I, II, IV, and V) NOT DETECTED NOT DETECTED Final         Radiology Studies: No results found.      Scheduled Meds: . aspirin EC  81 mg Oral Daily  . atenolol  25 mg Oral Daily  . chlorhexidine  15 mL Mouth Rinse BID  .  escitalopram  10 mg Oral Daily  . insulin aspart  0-9 Units Subcutaneous TID WC  . mouth rinse  15 mL Mouth Rinse q12n4p  . [START ON 05/17/2017] rivaroxaban   Does not apply Once  . Rivaroxaban  15 mg Oral BID WC  . [START ON 06/06/2017] rivaroxaban  20 mg Oral Q supper  . saccharomyces boulardii  250 mg Oral BID  . vancomycin  125 mg Oral Q6H   Continuous Infusions: . sodium chloride 75 mL/hr at 05/16/17 0856  . cefTRIAXone (ROCEPHIN)  IV 1 g (05/16/17 1059)     LOS: 3 days        Glade Lloyd, MD [facility] Pager [phone]  If 7PM-7AM, please contact night-coverage [email or web address] Password [facility] 05/16/2017, 12:43 PM

## 2017-05-16 NOTE — Progress Notes (Signed)
PT Cancellation Note  Patient Details Name: Shirley CreedClara Collins MRN: [id number] DOB: 12/31/1940   Cancelled Treatment:     spoke with pt and pt's daughter today. Pt refused, however patient's dtr really understanding and stated her mother was very "grouchy " today, and she asked what she could do to help today and if we could check back on her mom tomorrow. She stated she had been getting patient to EOB and to chair , and I encouraged her to continue to do this to keep her strength up.   Will check back on patient tomorrow.    Marella BileBRITT, Daniil Labarge 05/16/2017, 2:24 PM  Marella BileSharron Tammie Ellsworth, PT Pager: [phone] 05/16/2017

## 2017-05-16 NOTE — Progress Notes (Signed)
ANTICOAGULATION CONSULT NOTE   Pharmacy Consult for enoxaparin >> Xarelto Indication: VTE treatment  No Known Allergies  Patient Measurements: Height: 5\' 4"  (162.6 cm) Weight: 200 lb (90.7 kg) IBW/kg (Calculated) : 54.7   Vital Signs: Temp: 98.7 F (37.1 C) (06/23 0506) Temp Source: Oral (06/23 0506) BP: 162/56 (06/23 1044) Pulse Rate: 74 (06/23 1044)  Labs:  Recent Labs  05/13/17 1424  05/14/17 0735 05/15/17 0720 05/16/17 0631  HGB  --   < > 10.7* 10.8* 10.6*  HCT  --   --  31.0* 31.5* 31.7*  PLT  --   --  508* 543* 588*  LABPROT 14.8  --   --   --   --   INR 1.15  --   --   --   --   CREATININE  --   --  1.23* 0.86 0.76  < > = values in this interval not displayed.  Estimated Creatinine Clearance: 65.3 mL/min (by C-G formula based on SCr of 0.76 mg/dL).   Assessment: [age] year female with HTN and DM2 admitted for colitis and found to be C diff positive. Due to concomitant pain and swelling of LLE, US was performed which revealed acute DVT of L peroneal veins. Pharmacy consulted yesterday to dose enoxaparin for VTE treatment; now switching to Xarelto today.  Today, 05/16/2017:  CBC: Hgb low but stable, Plt elevated  Baseline INR wnl  CrCl wnl  Last dose of Lovenox 1 mg/kg at 0600 this AM  Drug interactions: Patient is on ASA/Plavix  Goal of Therapy:  Prevent VTE recurrence/worsening  Plan:   Stop Lovenox  Begin Xarelto 15 mg BID with meals x 21 days, starting with supper tonight  After 21 days, transition to Xarelto 20 mg daily with food  Patient is currently on ASA/Plavix for Hx CVA (type unspecified); after discussion with MD, will hold Plavix until further information can be obtained. Consider cardiology input if warranted, no PMH in Epic.  Will check CBC/SCr at least q72 hr while inpatient  F/u for s/s bleeding or worsening thrombosis   Bernadene Personrew Tashiya Souders, PharmD, BCPS Pager: [phone] 05/16/2017, 11:08 AM

## 2017-05-17 DIAGNOSIS — K625 Hemorrhage of anus and rectum: Secondary | ICD-10-CM | POA: Diagnosis present

## 2017-05-17 DIAGNOSIS — K5289 Other specified noninfective gastroenteritis and colitis: Secondary | ICD-10-CM

## 2017-05-17 LAB — BASIC METABOLIC PANEL
Anion gap: 7 (ref 5–15)
BUN: 9 mg/dL (ref 6–20)
CALCIUM: 8.1 mg/dL — AB (ref 8.9–10.3)
CO2: 23 mmol/L (ref 22–32)
Chloride: 108 mmol/L (ref 101–111)
Creatinine, Ser: 0.63 mg/dL (ref 0.44–1.00)
GFR calc Af Amer: >60 mL/min (ref >60)
GFR calc non Af Amer: >60 mL/min (ref >60)
GLUCOSE: 112 mg/dL — AB (ref 65–99)
Potassium: 3.9 mmol/L (ref 3.5–5.1)
Sodium: 138 mmol/L (ref 135–145)

## 2017-05-17 LAB — TROPONIN I: Troponin I: <0.03 ng/mL (ref <0.03)

## 2017-05-17 LAB — GLUCOSE, CAPILLARY
GLUCOSE-CAPILLARY: 117 mg/dL — AB (ref 65–99)
GLUCOSE-CAPILLARY: 157 mg/dL — AB (ref 65–99)
GLUCOSE-CAPILLARY: 100 mg/dL — AB (ref 65–99)
Glucose-Capillary: 122 mg/dL — ABNORMAL HIGH (ref 65–99)

## 2017-05-17 LAB — CBC
HCT: 31.6 % — ABNORMAL LOW (ref 36.0–46.0)
HEMOGLOBIN: 10.5 g/dL — AB (ref 12.0–15.0)
MCH: 21.6 pg — AB (ref 26.0–34.0)
MCHC: 33.2 g/dL (ref 30.0–36.0)
MCV: 65 fL — ABNORMAL LOW (ref 78.0–100.0)
Platelets: 610 10*3/uL — ABNORMAL HIGH (ref 150–400)
RBC: 4.86 MIL/uL (ref 3.87–5.11)
RDW: 15.7 % — AB (ref 11.5–15.5)
WBC: 13.2 10*3/uL — ABNORMAL HIGH (ref 4.0–10.5)

## 2017-05-17 LAB — MAGNESIUM: MAGNESIUM: 1.4 mg/dL — AB (ref 1.7–2.4)

## 2017-05-17 MED ORDER — POLYETHYLENE GLYCOL 3350 17 G PO PACK
17.0000 g | PACK | Freq: Four times a day (QID) | ORAL | Status: AC
  Administered 2017-05-17 (×2): 17 g via ORAL
  Filled 2017-05-17 (×3): qty 1

## 2017-05-17 MED ORDER — GI COCKTAIL ~~LOC~~
30.0000 mL | Freq: Three times a day (TID) | ORAL | Status: DC | PRN
  Administered 2017-05-18 – 2017-05-22 (×6): 30 mL via ORAL
  Filled 2017-05-17 (×6): qty 30

## 2017-05-17 MED ORDER — MAGNESIUM SULFATE 2 GM/50ML IV SOLN
2.0000 g | Freq: Once | INTRAVENOUS | Status: AC
  Administered 2017-05-17: 2 g via INTRAVENOUS
  Filled 2017-05-17: qty 50

## 2017-05-17 NOTE — Progress Notes (Signed)
PT Cancellation Note  Patient Details Name: Shirley Collins MRN: [id number] DOB: 05/02/1941   Cancelled Treatment:     Checked backa nd patient feeling better todaya dn dtr is in the room stating she is walking her to /from the bathroom and also saw them walking in the hallway with no AD and no LOB.   No acute PT needs at this time. Dtr will be with patient upon DC as well. Will sign off.    Tayen Narang 05/17/2017, 3:46 PM Mical Brun, PT Pager: [phone] 05/17/2017    

## 2017-05-17 NOTE — Progress Notes (Signed)
Patient ID: Shirley Collins, female   DOB: May 13, 1941, 76 y.o.   MRN: [id number]  PROGRESS NOTE    Shirley Collins  WUJ:[id number] DOB: February 07, 1941 DOA: 05/13/2017 PCP: Brenton Grills   Brief Narrative:  76 year female with history of hypertension and diabetes mellitus type 2 presented to the hospital with complaints of diarrhea and generalized weakness progressively getting worse. She was found to have diffuse colitis on CAT scan of the abdomen and pelvis and C. difficile was found to be positive. She was switched to oral vancomycin after initial ciprofloxacin and Flagyl intravenous treatment. She is still having a lot of diarrhea. She was found to have left lower extremity DVT for which she started on therapeutic Lovenox.    Assessment & Plan:   Principal Problem:   Colitis Active Problems:   Hyponatremia   Hypokalemia   Type 2 diabetes mellitus without complication (HCC)   AKI (acute kidney injury) (HCC)   Dehydration  Rectal bleeding Probably from a combination of severe colitis and anticoagulation and antiplatelet use. Stop aspirin and Xarelto for now. Spoke to Dr. Dulce Sellar from GI regarding the patient and he'll see the patient in consultation. Monitor H&H every 12 hours. Hemoglobin stable this morning. Plavix was already discontinued yesterday.  Probable acute C. difficile Colitis - Still having significant diarrhea. - Continue oral vancomycin for total of 10-14 days. This is her first episode of C. difficile colitis. Stool PCR is positive for enteropathogenic Escherichia coli. Zithromax discontinued on 05/16/2017, started Rocephin 1 g IV dailyon 05/16/2017: Continue for 3 doses in total.  Acute Left peroneal vein DVT Was started on Lovenox therapeutic dosing on 05/15/2017 and switched to oral Xarelto on 05/13/2017. Hold anticoagulation for nowtill GI bleeding resolves and cleared by gastroenterology. Acute kidney injury  -Resolved  Hypokalemia  - Probably secondary to  diarrhea -Resolved  Hypomagnesemia Replace and repeat a.m. labs  Hyponatremia - Resolved; likely secondary to dehydration and diarrhea  Diabetes mellitus type 2 with hyperglycemia -Hold oral hypoglycemic agents;SSI  HTN -Hold diuretics, continue atenolol to avoid rebound tachycardia. Blood pressure controlled  Leukocytosis Probably secondary to C. difficile colitis. Improving. Repeat a.m. Labs  Thrombocytosis Probably reactive. Repeat a.m. labs  DVT prophylaxis:Hold anticoagulation for now Code Status:Full code Family Communication:Discussed with daughter present at bedside Disposition Plan:Home in 1-3days once diarrhea improves  Consultants:GI consult called   Procedures: Bilateral lower extremity duplex ultrasound on 05/15/2017: Summary: There is evidence of acute deep vein thrombosis involving the peroneal veins of the left lower extremity.  There is no evidence of superficial vein thrombosis involving the left lower extremity. There is no evidence of deep or superficial vein thrombosis involving the right lower extremity. There is no evidence of a Baker&'s cyst bilaterally.  Antimicrobials: Oral vancomycin from 05/13/2017 Ciprofloxacin and Flagyl on 05/13/2017 and discontinued on the same day Zithromax from 05/15/2017-05/16/2017 Rocephin from 05/16/2017>>   Subjective: Patient seen and examined at bedside. She still having a lot of diarrhea. She complained of some chest pain which felt like indigestion this morning. She is also complaining of some intermittent abdominal pains. No fever, vomiting. Patient's daughter States that the patient  started having bright red blood per rectum last night and had multiple episodes throughout including this morning.  Objective: Vitals:   05/16/17 1044 05/16/17 1336 05/16/17 2136 05/17/17 0556  BP: (!) 162/56 (!) 161/50 (!) 148/59 139/62  Pulse: 74 67 65 64  Resp:  18 18 18   Temp:  97.4 F (36.3 C) 98.7  F (  37.1 C) 98.4 F (36.9 C)  TempSrc:  Oral Oral Oral  SpO2:  98% 99% 99%  Weight:      Height:        Intake/Output Summary (Last 24 hours) at 05/17/17 1049 Last data filed at 05/16/17 1829  Gross per 24 hour  Intake             1645 ml  Output                0 ml  Net             1645 ml   Filed Weights   05/13/17 1600  Weight: 90.7 kg (200 lb)    Examination:  General exam: Appears calm and comfortable  Respiratory system: Bilateral decreased breath sound at bases With scattered crackle Cardiovascular system: S1 & S2 heard, rate controlled  Gastrointestinal system: Abdomen is nondistended, soft and mildly tender over the periumbilical and epigastric regions. Normal bowel sounds heard. Central nervous system: Alert and oriented. No focal neurological deficits. Moving extremities Extremities: No cyanosis, clubbing, edema  Skin: No rashes, lesions or ulcers Lymph: No cervical lymphadenopathy  Data Reviewed: I have personally reviewed following labs and imaging studies  CBC:  Recent Labs Lab 05/13/17 0945 05/14/17 0735 05/15/17 0720 05/16/17 0631 05/17/17 0658  WBC 20.3* 18.5* 12.5* 11.7* 13.2*  HGB 12.7 10.7* 10.8* 10.6* 10.5*  HCT 36.1 31.0* 31.5* 31.7* 31.6*  MCV 63.8* 63.8* 63.9* 64.4* 65.0*  PLT 500* 508* 543* 588* 610*   Basic Metabolic Panel:  Recent Labs Lab 05/13/17 0945 05/14/17 0735 05/15/17 0720 05/16/17 0631 05/17/17 0658  NA 129* 136 135 137 138  K 2.4* 3.4* 3.6 4.0 3.9  CL 90* 102 105 106 108  CO2 25 24 22  21* 23  GLUCOSE 276* 140* 131* 119* 112*  BUN 23* 25* 20 13 9   CREATININE 1.48* 1.23* 0.86 0.76 0.63  CALCIUM 8.3* 8.3* 8.3* 8.1* 8.1*  MG 1.7 2.6* 2.2 1.7 1.4*   GFR: Estimated Creatinine Clearance: 65.3 mL/min (by C-G formula based on SCr of 0.63 mg/dL). Liver Function Tests:  Recent Labs Lab 05/13/17 0945  AST 45*  ALT 32  ALKPHOS 75  BILITOT 0.9  PROT 6.6  ALBUMIN 2.4*    Recent Labs Lab 05/13/17 0945  LIPASE  33   No results for input(s): AMMONIA in the last 168 hours. Coagulation Profile:  Recent Labs Lab 05/13/17 1424  INR 1.15   Cardiac Enzymes: No results for input(s): CKTOTAL, CKMB, CKMBINDEX, TROPONINI in the last 168 hours. BNP (last 3 results) No results for input(s): PROBNP in the last 8760 hours. HbA1C: No results for input(s): HGBA1C in the last 72 hours. CBG:  Recent Labs Lab 05/16/17 0741 05/16/17 1154 05/16/17 1714 05/16/17 2140 05/17/17 0732  GLUCAP 116* 137* 108* 137* 117*   Lipid Profile: No results for input(s): CHOL, HDL, LDLCALC, TRIG, CHOLHDL, LDLDIRECT in the last 72 hours. Thyroid Function Tests: No results for input(s): TSH, T4TOTAL, FREET4, T3FREE, THYROIDAB in the last 72 hours. Anemia Panel: No results for input(s): VITAMINB12, FOLATE, FERRITIN, TIBC, IRON, RETICCTPCT in the last 72 hours. Sepsis Labs: No results for input(s): PROCALCITON, LATICACIDVEN in the last 168 hours.  Recent Results (from the past 240 hour(s))  C difficile quick scan w PCR reflex     Status: Abnormal   Collection Time: 05/13/17  3:23 PM  Result Value Ref Range Status   C Diff antigen POSITIVE (A) NEGATIVE Final   C Diff toxin  POSITIVE (A) NEGATIVE Final   C Diff interpretation Toxin producing C. difficile detected.  Final    Comment: CRITICAL RESULT CALLED TO, READ BACK BY AND VERIFIED WITH: D.BLOCK RN AT 1828 ON 05/13/17 BY S.VANHOORNE   Gastrointestinal Panel by PCR , Stool     Status: Abnormal   Collection Time: 05/13/17  3:23 PM  Result Value Ref Range Status   Campylobacter species NOT DETECTED NOT DETECTED Final   Plesimonas shigelloides NOT DETECTED NOT DETECTED Final   Salmonella species NOT DETECTED NOT DETECTED Final   Yersinia enterocolitica NOT DETECTED NOT DETECTED Final   Vibrio species NOT DETECTED NOT DETECTED Final   Vibrio cholerae NOT DETECTED NOT DETECTED Final   Enteroaggregative E coli (EAEC) NOT DETECTED NOT DETECTED Final   Enteropathogenic E  coli (EPEC) DETECTED (A) NOT DETECTED Final    Comment: RESULT CALLED TO, READ BACK BY AND VERIFIED WITH: DAVID BLOCK AT 1409 ON 05/14/2017 JJB    Enterotoxigenic E coli (ETEC) NOT DETECTED NOT DETECTED Final   Shiga like toxin producing E coli (STEC) NOT DETECTED NOT DETECTED Final   Shigella/Enteroinvasive E coli (EIEC) NOT DETECTED NOT DETECTED Final   Cryptosporidium NOT DETECTED NOT DETECTED Final   Cyclospora cayetanensis NOT DETECTED NOT DETECTED Final   Entamoeba histolytica NOT DETECTED NOT DETECTED Final   Giardia lamblia NOT DETECTED NOT DETECTED Final   Adenovirus F40/41 NOT DETECTED NOT DETECTED Final   Astrovirus NOT DETECTED NOT DETECTED Final   Norovirus GI/GII NOT DETECTED NOT DETECTED Final   Rotavirus A NOT DETECTED NOT DETECTED Final   Sapovirus (I, II, IV, and V) NOT DETECTED NOT DETECTED Final         Radiology Studies: No results found.      Scheduled Meds: . atenolol  25 mg Oral Daily  . chlorhexidine  15 mL Mouth Rinse BID  . escitalopram  10 mg Oral Daily  . insulin aspart  0-9 Units Subcutaneous TID WC  . mouth rinse  15 mL Mouth Rinse q12n4p  . saccharomyces boulardii  250 mg Oral BID  . vancomycin  125 mg Oral Q6H   Continuous Infusions: . sodium chloride 75 mL/hr at 05/16/17 2252  . cefTRIAXone (ROCEPHIN)  IV Stopped (05/16/17 1129)     LOS: 4 days        Glade LloydKshitiz Lopez Dentinger, MD [facility] Pager [phone]  If 7PM-7AM, please contact night-coverage [email or web address] Password [email or web address] 05/17/2017, 10:49 AM

## 2017-05-17 NOTE — Consult Note (Signed)
[facility] Gastroenterology Consultation Note  Referring Provider: Dr. Hanley Ben [facility]) Primary Care Physician:  Brenton Grills  Reason for Consultation:  Diarrhea, blood in stool  HPI: Shirley Collins is a 76 y.o. female admitted for diarrhea.  Found to have C. Diff, treated, minimal improvement, now over 15 stools per day.  Found to have DVT, put on anticoagulation, then started having hematochezia.  Has lower abdominal discomfort. CT shows right and transverse colitis.   Past Medical History:  Diagnosis Date  . Anemia   . Anxiety   . Arthritis   . Asthma   . CHF (congestive heart failure) (HCC)   . Chronic kidney disease   . Coronary artery disease   . Dementia   . Depression   . Diabetes mellitus without complication (HCC)   . GERD (gastroesophageal reflux disease)   . Hypertension   . Pneumonia   . Stroke [facility])     Past Surgical History:  Procedure Laterality Date  . CATARACT EXTRACTION, BILATERAL    . CORONARY ANGIOPLASTY    . EYE SURGERY    . TUBAL LIGATION      Prior to Admission medications   Medication Sig Start Date End Date Taking? Authorizing Provider  acetaminophen (TYLENOL) 650 MG CR tablet Take 1,300 mg by mouth every 8 (eight) hours as needed for pain.   Yes [provider]  amLODipine (NORVASC) 10 MG tablet Take 10 mg by mouth daily. 03/02/17  Yes [provider]  aspirin EC 81 MG tablet Take 81 mg by mouth daily.   Yes [provider]  atenolol (TENORMIN) 25 MG tablet Take 25 mg by mouth daily. 04/27/17  Yes [provider]  Cholecalciferol (D-3-5) 5000 units capsule Take 5,000 Units by mouth daily.   Yes [provider]  clopidogrel (PLAVIX) 75 MG tablet Take 75 mg by mouth daily. 04/25/17  Yes [provider]  DULoxetine (CYMBALTA) 30 MG capsule Take 30 mg by mouth daily. 04/25/17  Yes [provider]  DULoxetine (CYMBALTA) 60 MG capsule Take 60 mg by mouth every morning. 04/25/17  Yes [provider]  escitalopram (LEXAPRO) 10 MG tablet Take 10 mg by mouth daily. 04/25/17  Yes [provider]  ferrous sulfate 325 (65 FE) MG tablet Take 325 mg by mouth 3 (three) times daily with meals.   Yes [provider]  JARDIANCE 10 MG TABS tablet Take 10 mg by mouth daily. 05/12/17  Yes [provider]  pantoprazole (PROTONIX) 40 MG tablet Take 80 mg by mouth daily. 04/25/17  Yes [provider]  pioglitazone (ACTOS) 30 MG tablet Take 30 mg by mouth daily. 04/25/17  Yes [provider]  triamterene-hydrochlorothiazide (MAXZIDE-25) 37.5-25 MG tablet Take 1 tablet by mouth daily. 04/25/17  Yes [provider]  VENTOLIN HFA 108 (90 Base) MCG/ACT inhaler Inhale 1 puff into the lungs every 6 (six) hours as needed for wheezing or shortness of breath. 02/11/17  Yes [provider]    Current Facility-Administered Medications  Medication Dose Route Frequency Provider Last Rate Last Dose  . 0.9 %  sodium chloride infusion   Intravenous Continuous Glade Lloyd, MD 75 mL/hr at 05/17/17 1341    . acetaminophen (TYLENOL) tablet 650 mg  650 mg Oral Q6H PRN Clydia Llano, MD       Or  . acetaminophen (TYLENOL) suppository 650 mg  650 mg Rectal Q6H PRN Elmahi, Mutaz, MD      . albuterol (PROVENTIL) (2.5 MG/3ML) 0.083% nebulizer solution  2.5 mg  2.5 mg Nebulization Q4H PRN Leda GauzeKirby-Graham, Karen J, NP   2.5 mg at 05/16/17 2324  . atenolol (TENORMIN) tablet 25 mg  25 mg Oral Daily Clydia LlanoElmahi, Mutaz, MD   25 mg at 05/17/17 1137  . cefTRIAXone (ROCEPHIN) 1 g in dextrose 5 % 50 mL IVPB  1 g Intravenous Q24H Glade LloydAlekh, Kshitiz, MD   Stopped at 05/17/17 1344  . chlorhexidine (PERIDEX) 0.12 % solution 15 mL  15 mL Mouth Rinse BID Clydia LlanoElmahi, Mutaz, MD   15 mL at 05/17/17 1335  . escitalopram (LEXAPRO) tablet 10 mg  10 mg Oral Daily Clydia LlanoElmahi, Mutaz, MD   10 mg at 05/17/17 1138  . gi cocktail (Maalox,Lidocaine,Donnatal)  30 mL Oral TID PRN Glade LloydAlekh, Kshitiz, MD      .  HYDROcodone-acetaminophen (NORCO/VICODIN) 5-325 MG per tablet 1-2 tablet  1-2 tablet Oral Q4H PRN Clydia LlanoElmahi, Mutaz, MD   1 tablet at 05/14/17 2331  . insulin aspart (novoLOG) injection 0-9 Units  0-9 Units Subcutaneous TID WC Clydia LlanoElmahi, Mutaz, MD   2 Units at 05/17/17 1336  . magnesium sulfate IVPB 2 g 50 mL  2 g Intravenous Once Glade LloydAlekh, Kshitiz, MD 50 mL/hr at 05/17/17 1336 2 g at 05/17/17 1336  . MEDLINE mouth rinse  15 mL Mouth Rinse q12n4p Clydia LlanoElmahi, Mutaz, MD   15 mL at 05/17/17 1200  . ondansetron (ZOFRAN) tablet 4 mg  4 mg Oral Q6H PRN Clydia LlanoElmahi, Mutaz, MD       Or  . ondansetron (ZOFRAN) injection 4 mg  4 mg Intravenous Q6H PRN Clydia LlanoElmahi, Mutaz, MD      . saccharomyces boulardii (FLORASTOR) capsule 250 mg  250 mg Oral BID Clydia LlanoElmahi, Mutaz, MD   250 mg at 05/17/17 1137  . traMADol (ULTRAM) tablet 50 mg  50 mg Oral Q6H PRN Glade LloydAlekh, Kshitiz, MD   50 mg at 05/17/17 0021  . vancomycin (VANCOCIN) 50 mg/mL oral solution 125 mg  125 mg Oral Q6H Clydia LlanoElmahi, Mutaz, MD   125 mg at 05/17/17 0659    Allergies as of 05/13/2017  . (No Known Allergies)    History reviewed. No pertinent family history.  Social History   Social History  . Marital status: Widowed    Spouse name: N/A  . Number of children: N/A  . Years of education: N/A   Occupational History  . Not on file.   Social History Main Topics  . Smoking status: Former Smoker    Packs/day: 1.00    Years: 20.00    Types: Cigarettes  . Smokeless tobacco: Never Used  . Alcohol use No  . Drug use: No  . Sexual activity: No   Other Topics Concern  . Not on file   Social History Narrative  . No narrative on file    Review of Systems: As per HPI, others negative  Physical Exam: Vital signs in last 24 hours: Temp:  [98.4 F (36.9 C)-98.7 F (37.1 C)] 98.4 F (36.9 C) (06/24 0556) Pulse Rate:  [64-65] 64 (06/24 0556) Resp:  [18] 18 (06/24 0556) BP: (139-148)/(59-62) 139/62 (06/24 0556) SpO2:  [99 %] 99 % (06/24 0556) Last BM Date:  05/16/17 General:   Alert, overweight, Well-developed, well-nourished, pleasant and cooperative in NAD Head:  Normocephalic and atraumatic. Eyes:  Sclera clear, no icterus.   Conjunctiva pink. Ears:  Normal auditory acuity. Nose:  No deformity, discharge,  or lesions. Mouth:  No deformity or lesions.  Oropharynx pink but dry Neck:  Supple; no masses or thyromegaly. Lungs:  Clear throughout to auscultation.   No wheezes, crackles, or rhonchi. No acute distress. Heart:  Regular rate and rhythm; no murmurs, clicks, rubs,  or gallops. Abdomen:  Soft, protuberant, mild lower abdominal tenderness, nondistended. No masses, hepatosplenomegaly or hernias noted. Normal bowel sounds, without guarding, and without rebound.     Msk:  Symmetrical without gross deformities. Normal posture. Pulses:  Normal pulses noted. Extremities:  Without clubbing or edema. Neurologic:  Alert and  oriented x4; diffusely weak Skin:  Intact without significant lesions or rashes. Psych:  Alert and cooperative. Depressed mood, flat affect   Lab Results:  Recent Labs  05/15/17 0720 05/16/17 0631 05/17/17 0658  WBC 12.5* 11.7* 13.2*  HGB 10.8* 10.6* 10.5*  HCT 31.5* 31.7* 31.6*  PLT 543* 588* 610*   BMET  Recent Labs  05/15/17 0720 05/16/17 0631 05/17/17 0658  NA 135 137 138  K 3.6 4.0 3.9  CL 105 106 108  CO2 22 21* 23  GLUCOSE 131* 119* 112*  BUN 20 13 9   CREATININE 0.86 0.76 0.63  CALCIUM 8.3* 8.1* 8.1*   LFT No results for input(s): PROT, ALBUMIN, AST, ALT, ALKPHOS, BILITOT, BILIDIR, IBILI in the last 72 hours. PT/INR No results for input(s): LABPROT, INR in the last 72 hours.  Studies/Results: No results found.  Impression:  1.  Diarrhea, with positive PCR C. Diff. 2.  Hematochezia, new onset, starting after initiation of anticoagulation for DVT. 3.  DVT, on anticoagulation. 4.  Abnormal CT abdomen, transverse and right-sided colitis.  Plan:  1.  Off rivaroxaban; on Lovenox. 2.   Flex sig tomorrow with some Miralax, Lovenox AM dose tomorrow needs to be held. 3.  Continue medical treatment for c. Diff; if no coexisting inflammatory colitis or other cause of bleeding seen on CT scan, consider escalation of C. Diff therapy.   LOS: 4 days   Caraline Deutschman M  05/17/2017, 2:20 PM  Pager [phone] If no answer or after 5 PM call [phone]

## 2017-05-17 NOTE — Progress Notes (Signed)
Pt complaint of discomfort under right breast. Non-radiating and describes as "pressure". VS 171/54-73-16 O2 sat 100% on RA. No sweating or shortness of breath ("beyond normal"). Rates pain as "5". EKG obtained. MD notified with orders received. Melton Alarana A Dupree Givler, RN

## 2017-05-18 ENCOUNTER — Encounter (HOSPITAL_COMMUNITY): Payer: Self-pay | Admitting: *Deleted

## 2017-05-18 ENCOUNTER — Inpatient Hospital Stay (HOSPITAL_COMMUNITY): Payer: Medicare (Managed Care) | Admitting: Certified Registered Nurse Anesthetist

## 2017-05-18 ENCOUNTER — Encounter (HOSPITAL_COMMUNITY)
Admission: EM | Disposition: A | Discharge status: Home / Self Care | Payer: Self-pay | Source: Home / Self Care | Attending: Internal Medicine

## 2017-05-18 HISTORY — PX: FLEXIBLE SIGMOIDOSCOPY: SHX5431

## 2017-05-18 LAB — BASIC METABOLIC PANEL
Anion gap: 9 (ref 5–15)
BUN: <5 mg/dL — ABNORMAL LOW (ref 6–20)
CALCIUM: 8 mg/dL — AB (ref 8.9–10.3)
CO2: 24 mmol/L (ref 22–32)
CREATININE: 0.58 mg/dL (ref 0.44–1.00)
Chloride: 105 mmol/L (ref 101–111)
GFR calc Af Amer: >60 mL/min (ref >60)
GFR calc non Af Amer: >60 mL/min (ref >60)
GLUCOSE: 127 mg/dL — AB (ref 65–99)
Potassium: 4.4 mmol/L (ref 3.5–5.1)
Sodium: 138 mmol/L (ref 135–145)

## 2017-05-18 LAB — CBC
HCT: 32.7 % — ABNORMAL LOW (ref 36.0–46.0)
Hemoglobin: 11.1 g/dL — ABNORMAL LOW (ref 12.0–15.0)
MCH: 22.1 pg — AB (ref 26.0–34.0)
MCHC: 33.9 g/dL (ref 30.0–36.0)
MCV: 65.1 fL — ABNORMAL LOW (ref 78.0–100.0)
PLATELETS: 603 10*3/uL — AB (ref 150–400)
RBC: 5.02 MIL/uL (ref 3.87–5.11)
RDW: 16 % — AB (ref 11.5–15.5)
WBC: 15.5 10*3/uL — ABNORMAL HIGH (ref 4.0–10.5)

## 2017-05-18 LAB — HEMOGLOBIN AND HEMATOCRIT, BLOOD
HCT: 33.4 % — ABNORMAL LOW (ref 36.0–46.0)
Hemoglobin: 11.1 g/dL — ABNORMAL LOW (ref 12.0–15.0)

## 2017-05-18 LAB — GLUCOSE, CAPILLARY
GLUCOSE-CAPILLARY: 121 mg/dL — AB (ref 65–99)
GLUCOSE-CAPILLARY: 110 mg/dL — AB (ref 65–99)
Glucose-Capillary: 152 mg/dL — ABNORMAL HIGH (ref 65–99)
Glucose-Capillary: 126 mg/dL — ABNORMAL HIGH (ref 65–99)

## 2017-05-18 LAB — MAGNESIUM: MAGNESIUM: 1.3 mg/dL — AB (ref 1.7–2.4)

## 2017-05-18 SURGERY — SIGMOIDOSCOPY, FLEXIBLE
Anesthesia: Monitor Anesthesia Care | Laterality: Left

## 2017-05-18 MED ORDER — MAGNESIUM SULFATE 2 GM/50ML IV SOLN
2.0000 g | Freq: Once | INTRAVENOUS | Status: AC
  Administered 2017-05-18: 2 g via INTRAVENOUS

## 2017-05-18 MED ORDER — PROPOFOL 500 MG/50ML IV EMUL
INTRAVENOUS | Status: DC | PRN
  Administered 2017-05-18: 40 mg via INTRAVENOUS

## 2017-05-18 MED ORDER — PROPOFOL 500 MG/50ML IV EMUL
INTRAVENOUS | Status: DC | PRN
  Administered 2017-05-18: 125 ug/kg/min via INTRAVENOUS

## 2017-05-18 MED ORDER — LORAZEPAM 1 MG PO TABS
1.0000 mg | ORAL_TABLET | Freq: Once | ORAL | Status: AC
  Administered 2017-05-18: 1 mg via ORAL
  Filled 2017-05-18: qty 1

## 2017-05-18 MED ORDER — SODIUM CHLORIDE 0.9 % IV SOLN: INTRAVENOUS | Status: DC

## 2017-05-18 MED ORDER — LACTATED RINGERS IV SOLN
INTRAVENOUS | Status: DC
  Administered 2017-05-18: 10:00:00 via INTRAVENOUS

## 2017-05-18 MED ORDER — PROPOFOL 10 MG/ML IV BOLUS
INTRAVENOUS | Status: AC
  Filled 2017-05-18: qty 40

## 2017-05-18 MED ORDER — METRONIDAZOLE IN NACL 5-0.79 MG/ML-% IV SOLN
500.0000 mg | Freq: Three times a day (TID) | INTRAVENOUS | Status: DC
  Administered 2017-05-18 – 2017-05-20 (×6): 500 mg via INTRAVENOUS
  Filled 2017-05-18 (×7): qty 100

## 2017-05-18 NOTE — Progress Notes (Signed)
Extended sigmoidoscopy to about the distal transverse colon shows typical to almost classic pseudomembranes with some severe friability and erythema in the distal rectum. This is clearly C. difficile colitis, cannot rule out a superimposed IBD proctitis. For now will continue oral vancomycin and florastor and will add IV Flagyl. We'll follow with you.

## 2017-05-18 NOTE — Progress Notes (Signed)
Patient ID: Shirley Collins, female   DOB: 14-Apr-1941, 76 y.o.   MRN: [id number]  PROGRESS NOTE    Marcedes Tech  WUJ:[id number] DOB: 1941-10-27 DOA: 05/13/2017 PCP: Brenton Grills   Brief Narrative:  76 year female with history of hypertension and diabetes mellitus type 2 presented to the hospital with complaints of diarrhea and generalized weakness progressively getting worse. She was found to have diffuse colitis on CAT scan of the abdomen and pelvis and C. difficile was found to be positive. She was switched to oral vancomycin after initial ciprofloxacin and Flagyl intravenous treatment. She is still having a lot of diarrhea. She was found to have left lower extremity DVT for which she started on therapeutic Lovenox which was switched to oral Xarelto. She started having rectal bleeding; antiplatelets and anticoagulants were discontinued. Patient is supposed to be having flexible sigmoidoscopy today by GI.   Assessment & Plan:   Principal Problem:   Colitis Active Problems:   Hyponatremia   Hypokalemia   Type 2 diabetes mellitus without complication (HCC)   AKI (acute kidney injury) (HCC)   Dehydration   Rectal bleeding   Rectal bleeding Probably from a combination of severe colitis and anticoagulation and antiplatelet use. Stop aspirin and Xarelto. Monitor H&H every 12 hours. Hemoglobin stable this morning. Plavix was already discontinued. - Plan for flexible sigmoidoscopy by GI; will follow-up with their recommendations  Probable acute C. difficile Colitis - Still having significant diarrhea. - Continue oral vancomycin for total of 10-14 days. This is her first episode of C. difficile colitis. Stool PCR is positive for enteropathogenic Escherichia coli. Zithromax discontinued on 05/16/2017, started Rocephin 1 g IV dailyon 05/16/2017: Discontinue Rocephin today.  Acute Left peroneal vein DVT Was started on Lovenox therapeutic dosing on 05/15/2017 and switched to oral Xarelto on  05/13/2017. Hold anticoagulation for nowtill GI bleeding resolves and cleared by gastroenterology.  Acute kidney injury -Resolved  Hypokalemia  - Probably secondary to diarrhea -Resolved  Hypomagnesemia Replace and repeat a.m. labs  Hyponatremia - Resolved;likely secondary to dehydration and diarrhea  Diabetes mellitus type 2 with hyperglycemia -Hold oral hypoglycemic agents;SSI  HTN -Hold diuretics, continue atenolol to avoid rebound tachycardia. Blood pressure controlled  Leukocytosis Probably secondary to C. difficile colitis. Improving. Repeat a.m. Labs  Thrombocytosis Probably reactive. Repeat a.m. labs  DVT prophylaxis:Hold anticoagulation for now Code Status:Full code Family Communication:Discussed with daughter present at bedside Disposition Plan:Home in 1-3 days oncediarrhea improves  Consultants:GI  Procedures: Bilateral lower extremity duplex ultrasound on 05/15/2017: Summary: There is evidence of acute deep vein thrombosis involving the peroneal veins of the left lower extremity.  There is no evidence of superficial vein thrombosis involving the left lower extremity. There is no evidence of deep or superficial vein thrombosis involving the right lower extremity. There is no evidence of a Baker&'s cyst bilaterally.  Antimicrobials: Oral vancomycin from 05/13/2017 Ciprofloxacin and Flagyl on 05/13/2017 and discontinued on the same day Zithromax from 05/15/2017-05/16/2017 Rocephin from 05/16/2017>>  Subjective: Patient seen and examined at bedside. She complains of intermittent abdominal pains and lower extremity pain. No overnight fever. She is still having a lot of diarrhea with rectal bleeding reported by the patient's daughter.  Objective: Vitals:   05/18/17 0611 05/18/17 1008 05/18/17 1055 05/18/17 1100  BP: (!) 157/46 (!) 177/84 129/69   Pulse: 68 75 77   Resp: 20 15 (!) 41   Temp: 98.4 F (36.9 C) 98.3 F (36.8 C)  98.1 F (36.7 C)   TempSrc: Oral Oral  Oral   SpO2: 95% 99% 100% 100%  Weight:  90.7 kg (200 lb)    Height:  5\' 4"  (1.626 m)      Intake/Output Summary (Last 24 hours) at 05/18/17 1119 Last data filed at 05/18/17 1057  Gross per 24 hour  Intake              440 ml  Output                0 ml  Net              440 ml   Filed Weights   05/13/17 1600 05/18/17 1008  Weight: 90.7 kg (200 lb) 90.7 kg (200 lb)    Examination:  General exam: Appears calm and comfortable  Respiratory system: Bilateral decreased breath sound at bases With scattered crackles Cardiovascular system: S1 & S2 heard, rate controlled  Gastrointestinal system: Abdomen is nondistended, soft and mildly tender over the periumbilical and epigastric regions.  Normal bowel sounds heard. Extremities: No cyanosis, clubbing, edema     Data Reviewed: I have personally reviewed following labs and imaging studies  CBC:  Recent Labs Lab 05/14/17 0735 05/15/17 0720 05/16/17 0631 05/17/17 0658 05/18/17 0741  WBC 18.5* 12.5* 11.7* 13.2* 15.5*  HGB 10.7* 10.8* 10.6* 10.5* 11.1*  HCT 31.0* 31.5* 31.7* 31.6* 32.7*  MCV 63.8* 63.9* 64.4* 65.0* 65.1*  PLT 508* 543* 588* 610* 603*   Basic Metabolic Panel:  Recent Labs Lab 05/14/17 0735 05/15/17 0720 05/16/17 0631 05/17/17 0658 05/18/17 0741  NA 136 135 137 138 138  K 3.4* 3.6 4.0 3.9 4.4  CL 102 105 106 108 105  CO2 24 22 21* 23 24  GLUCOSE 140* 131* 119* 112* 127*  BUN 25* 20 13 9  <5*  CREATININE 1.23* 0.86 0.76 0.63 0.58  CALCIUM 8.3* 8.3* 8.1* 8.1* 8.0*  MG 2.6* 2.2 1.7 1.4* 1.3*   GFR: Estimated Creatinine Clearance: 65.3 mL/min (by C-G formula based on SCr of 0.58 mg/dL). Liver Function Tests:  Recent Labs Lab 05/13/17 0945  AST 45*  ALT 32  ALKPHOS 75  BILITOT 0.9  PROT 6.6  ALBUMIN 2.4*    Recent Labs Lab 05/13/17 0945  LIPASE 33   No results for input(s): AMMONIA in the last 168 hours. Coagulation Profile:  Recent Labs Lab  05/13/17 1424  INR 1.15   Cardiac Enzymes:  Recent Labs Lab 05/17/17 0958  TROPONINI <0.03   BNP (last 3 results) No results for input(s): PROBNP in the last 8760 hours. HbA1C: No results for input(s): HGBA1C in the last 72 hours. CBG:  Recent Labs Lab 05/17/17 0732 05/17/17 1106 05/17/17 1706 05/17/17 2148 05/18/17 0744  GLUCAP 117* 157* 100* 122* 121*   Lipid Profile: No results for input(s): CHOL, HDL, LDLCALC, TRIG, CHOLHDL, LDLDIRECT in the last 72 hours. Thyroid Function Tests: No results for input(s): TSH, T4TOTAL, FREET4, T3FREE, THYROIDAB in the last 72 hours. Anemia Panel: No results for input(s): VITAMINB12, FOLATE, FERRITIN, TIBC, IRON, RETICCTPCT in the last 72 hours. Sepsis Labs: No results for input(s): PROCALCITON, LATICACIDVEN in the last 168 hours.  Recent Results (from the past 240 hour(s))  C difficile quick scan w PCR reflex     Status: Abnormal   Collection Time: 05/13/17  3:23 PM  Result Value Ref Range Status   C Diff antigen POSITIVE (A) NEGATIVE Final   C Diff toxin POSITIVE (A) NEGATIVE Final   C Diff interpretation Toxin producing C. difficile detected.  Final  Comment: CRITICAL RESULT CALLED TO, READ BACK BY AND VERIFIED WITH: D.BLOCK RN AT 1828 ON 05/13/17 BY S.VANHOORNE   Gastrointestinal Panel by PCR , Stool     Status: Abnormal   Collection Time: 05/13/17  3:23 PM  Result Value Ref Range Status   Campylobacter species NOT DETECTED NOT DETECTED Final   Plesimonas shigelloides NOT DETECTED NOT DETECTED Final   Salmonella species NOT DETECTED NOT DETECTED Final   Yersinia enterocolitica NOT DETECTED NOT DETECTED Final   Vibrio species NOT DETECTED NOT DETECTED Final   Vibrio cholerae NOT DETECTED NOT DETECTED Final   Enteroaggregative E coli (EAEC) NOT DETECTED NOT DETECTED Final   Enteropathogenic E coli (EPEC) DETECTED (A) NOT DETECTED Final    Comment: RESULT CALLED TO, READ BACK BY AND VERIFIED WITH: DAVID BLOCK AT 1409 ON  05/14/2017 JJB    Enterotoxigenic E coli (ETEC) NOT DETECTED NOT DETECTED Final   Shiga like toxin producing E coli (STEC) NOT DETECTED NOT DETECTED Final   Shigella/Enteroinvasive E coli (EIEC) NOT DETECTED NOT DETECTED Final   Cryptosporidium NOT DETECTED NOT DETECTED Final   Cyclospora cayetanensis NOT DETECTED NOT DETECTED Final   Entamoeba histolytica NOT DETECTED NOT DETECTED Final   Giardia lamblia NOT DETECTED NOT DETECTED Final   Adenovirus F40/41 NOT DETECTED NOT DETECTED Final   Astrovirus NOT DETECTED NOT DETECTED Final   Norovirus GI/GII NOT DETECTED NOT DETECTED Final   Rotavirus A NOT DETECTED NOT DETECTED Final   Sapovirus (I, II, IV, and V) NOT DETECTED NOT DETECTED Final         Radiology Studies: No results found.      Scheduled Meds: . [MAR Hold] atenolol  25 mg Oral Daily  . [MAR Hold] chlorhexidine  15 mL Mouth Rinse BID  . [MAR Hold] escitalopram  10 mg Oral Daily  . [MAR Hold] insulin aspart  0-9 Units Subcutaneous TID WC  . [MAR Hold] mouth rinse  15 mL Mouth Rinse q12n4p  . [MAR Hold] polyethylene glycol  17 g Oral QID  . [MAR Hold] saccharomyces boulardii  250 mg Oral BID  . [MAR Hold] vancomycin  125 mg Oral Q6H   Continuous Infusions: . sodium chloride 75 mL/hr at 05/17/17 1341  . lactated ringers 20 mL/hr at 05/18/17 1012  . metronidazole       LOS: 5 days        Glade Lloyd, MD [facility] Pager [phone]  If 7PM-7AM, please contact night-coverage [email or web address] Password [email or web address] 05/18/2017, 11:19 AM

## 2017-05-18 NOTE — Interval H&P Note (Signed)
History and Physical Interval Note:  05/18/2017 10:23 AM  Shirley Collins  has presented today for surgery, with the diagnosis of diarrhea, hematochezia  The various methods of treatment have been discussed with the patient and family. After consideration of risks, benefits and other options for treatment, the patient has consented to  Procedure(s): FLEXIBLE SIGMOIDOSCOPY (Left) as a surgical intervention .  The patient's history has been reviewed, patient examined, no change in status, stable for surgery.  I have reviewed the patient's chart and labs.  Questions were answered to the patient's satisfaction.     Amirra Herling C

## 2017-05-18 NOTE — Op Note (Signed)
[facility] Patient Name: Shirley CreedClara Rump Procedure Date: 05/18/2017 MRN: [id number] Attending MD: Barrie FolkJohn C Domanique Huesman , MD Date of Birth: 11/21/1941 CSN: [id number] Age: 76 Admit Type: Inpatient Procedure:                Flexible Sigmoidoscopy Indications:              Rectal hemorrhage, Infectious diarrhea Providers:                Everardo AllJohn C. Madilyn FiremanHayes, MD, Carin HockSharon Ricketts, RN, Harrington ChallengerHope                            Parker, Technician, Mirian MoKelley Carver, CRNA Referring MD:              Medicines:                Propofol per Anesthesia Complications:            No immediate complications. Estimated Blood Loss:     Estimated blood loss: none. Procedure:                Pre-Anesthesia Assessment:                           - Prior to the procedure, a History and Physical                            was performed, and patient medications and                            allergies were reviewed. The patient's tolerance of                            previous anesthesia was also reviewed. The risks                            and benefits of the procedure and the sedation                            options and risks were discussed with the patient.                            All questions were answered, and informed consent                            was obtained. Prior Anticoagulants: The patient has                            taken Plavix (clopidogrel), last dose was 3 days                            prior to procedure. ASA Grade Assessment: III - A                            patient with severe systemic disease. After  reviewing the risks and benefits, the patient was                            deemed in satisfactory condition to undergo the                            procedure.                           After obtaining informed consent, the scope was                            passed under direct vision. The EC-[id number] ([id number])                            scope was introduced  through the anus and advanced                            to the the left transverse colon. The flexible                            sigmoidoscopy was accomplished without difficulty.                            The patient tolerated the procedure well. The                            quality of the bowel preparation was fair. Scope In: Scope Out: Findings:      A diffuse area of moderately altered vascular, congested, inflamed and       pseudopolypoid mucosa was found in the rectum, in the recto-sigmoid       colon, in the sigmoid colon, in the descending colon and in the distal       transverse colon. Biopsies were taken with a cold forceps for histology. Impression:               - Preparation of the colon was fair.                           - Altered vascular, congested, inflamed and                            pseudopolypoid mucosa in the rectum, in the                            recto-sigmoid colon, in the sigmoid colon, in the                            descending colon and in the distal transverse                            colon. Biopsied. Moderate Sedation:      no moderate sedation Recommendation:           - Vancocin (vancomycin) 250 mg PO QID.                           -  Flagyl (metronidazole) 500 mg IV loading dose                            followed by 500 mg IV q 6 hr. Procedure Code(s):        --- Professional ---                           [phone], Sigmoidoscopy, flexible; with biopsy, single                            or multiple Diagnosis Code(s):        --- Professional ---                           K52.9, Noninfective gastroenteritis and colitis,                            unspecified                           K62.89, Other specified diseases of anus and rectum                           K63.89, Other specified diseases of intestine                           K62.5, Hemorrhage of anus and rectum                           A09, Infectious gastroenteritis and colitis,                             unspecified CPT copyright 2016 American Medical Association. All rights reserved. The codes documented in this report are preliminary and upon coder review may  be revised to meet current compliance requirements. Barrie Folk, MD 05/18/2017 10:55:50 AM This report has been signed electronically. Number of Addenda: 0

## 2017-05-18 NOTE — Anesthesia Preprocedure Evaluation (Signed)
Anesthesia Evaluation  Patient identified by MRN, date of birth, ID band Patient awake    Reviewed: Allergy & Precautions, NPO status , Patient's Chart, lab work & pertinent test results  Airway Mallampati: II  TM Distance: >3 FB Neck ROM: Full    Dental no notable dental hx. (+) Edentulous Upper, Edentulous Lower   Pulmonary neg pulmonary ROS, former smoker,    Pulmonary exam normal breath sounds clear to auscultation       Cardiovascular hypertension, negative cardio ROS Normal cardiovascular exam Rhythm:Regular Rate:Normal     Neuro/Psych negative neurological ROS  negative psych ROS   GI/Hepatic negative GI ROS, Neg liver ROS,   Endo/Other  negative endocrine ROSdiabetes  Renal/GU negative Renal ROS  negative genitourinary   Musculoskeletal negative musculoskeletal ROS (+)   Abdominal   Peds negative pediatric ROS (+)  Hematology negative hematology ROS (+)   Anesthesia Other Findings   Reproductive/Obstetrics negative OB ROS                             Anesthesia Physical Anesthesia Plan  ASA: III  Anesthesia Plan: MAC   Post-op Pain Management:    Induction: Intravenous  PONV Risk Score and Plan: 2 and Ondansetron and Dexamethasone  Airway Management Planned: Nasal Cannula  Additional Equipment:   Intra-op Plan:   Post-operative Plan:   Informed Consent: I have reviewed the patients History and Physical, chart, labs and discussed the procedure including the risks, benefits and alternatives for the proposed anesthesia with the patient or authorized representative who has indicated his/her understanding and acceptance.   Dental advisory given  Plan Discussed with: CRNA  Anesthesia Plan Comments:         Anesthesia Quick Evaluation

## 2017-05-18 NOTE — Transfer of Care (Signed)
Immediate Anesthesia Transfer of Care Note  Patient: Shirley Collins  Procedure(s) Performed: Procedure(s): FLEXIBLE SIGMOIDOSCOPY (Left)  Patient Location: PACU  Anesthesia Type:MAC  Level of Consciousness: sedated and patient cooperative  Airway & Oxygen Therapy: Patient Spontanous Breathing and Patient connected to face mask oxygen  Post-op Assessment: Report given to RN and Post -op Vital signs reviewed and stable  Post vital signs: Reviewed and stable  Last Vitals:  Vitals:   05/18/17 0611 05/18/17 1008  BP: (!) 157/46 (!) 177/84  Pulse: 68 75  Resp: 20 15  Temp: 36.9 C 36.8 C    Last Pain:  Vitals:   05/18/17 1008  TempSrc: Oral  PainSc:       Patients Stated Pain Goal: 0 (90/90/30 1499)  Complications: No apparent anesthesia complications

## 2017-05-18 NOTE — Anesthesia Postprocedure Evaluation (Signed)
Anesthesia Post Note  Patient: Shirley Collins  Procedure(s) Performed: Procedure(s) (LRB): FLEXIBLE SIGMOIDOSCOPY (Left)     Patient location during evaluation: PACU Anesthesia Type: MAC Level of consciousness: awake and alert Pain management: pain level controlled Vital Signs Assessment: post-procedure vital signs reviewed and stable Respiratory status: spontaneous breathing, nonlabored ventilation and respiratory function stable Cardiovascular status: stable and blood pressure returned to baseline Anesthetic complications: no    Last Vitals:  Vitals:   05/18/17 1008 05/18/17 1055  BP: (!) 177/84 129/69  Pulse: 75 77  Resp: 15 (!) 41  Temp: 36.8 C 36.7 C    Last Pain:  Vitals:   05/18/17 1055  TempSrc: Oral  PainSc:                  Shirley Collins

## 2017-05-18 NOTE — H&P (View-Only) (Signed)
PT Cancellation Note  Patient Details Name: Shirley CreedClara Moradi MRN: [id number] DOB: 03/22/1941   Cancelled Treatment:     Leroy Seahecked backa nd patient feeling better todaya dn dtr is in the room stating she is walking her to /from the bathroom and also saw them walking in the hallway with no AD and no LOB.   No acute PT needs at this time. Dtr will be with patient upon DC as well. Will sign off.    Marella BileBRITT, Ole Lafon 05/17/2017, 3:46 PM Marella BileSharron Kirat Mezquita, PT Pager: [phone] 05/17/2017

## 2017-05-19 ENCOUNTER — Encounter (HOSPITAL_COMMUNITY): Payer: Self-pay | Admitting: Gastroenterology

## 2017-05-19 LAB — CBC
HCT: 31.8 % — ABNORMAL LOW (ref 36.0–46.0)
HEMOGLOBIN: 10.4 g/dL — AB (ref 12.0–15.0)
MCH: 21.5 pg — AB (ref 26.0–34.0)
MCHC: 32.7 g/dL (ref 30.0–36.0)
MCV: 65.7 fL — ABNORMAL LOW (ref 78.0–100.0)
PLATELETS: 615 10*3/uL — AB (ref 150–400)
RBC: 4.84 MIL/uL (ref 3.87–5.11)
RDW: 15.8 % — ABNORMAL HIGH (ref 11.5–15.5)
WBC: 14.5 10*3/uL — ABNORMAL HIGH (ref 4.0–10.5)

## 2017-05-19 LAB — BASIC METABOLIC PANEL
ANION GAP: 9 (ref 5–15)
BUN: <5 mg/dL — ABNORMAL LOW (ref 6–20)
CO2: 27 mmol/L (ref 22–32)
CREATININE: 0.63 mg/dL (ref 0.44–1.00)
Calcium: 8 mg/dL — ABNORMAL LOW (ref 8.9–10.3)
Chloride: 104 mmol/L (ref 101–111)
GFR calc non Af Amer: >60 mL/min (ref >60)
Glucose, Bld: 117 mg/dL — ABNORMAL HIGH (ref 65–99)
Potassium: 3.6 mmol/L (ref 3.5–5.1)
Sodium: 140 mmol/L (ref 135–145)

## 2017-05-19 LAB — GLUCOSE, CAPILLARY
GLUCOSE-CAPILLARY: 128 mg/dL — AB (ref 65–99)
GLUCOSE-CAPILLARY: 123 mg/dL — AB (ref 65–99)
Glucose-Capillary: 122 mg/dL — ABNORMAL HIGH (ref 65–99)
Glucose-Capillary: 88 mg/dL (ref 65–99)

## 2017-05-19 LAB — MAGNESIUM: Magnesium: 1.4 mg/dL — ABNORMAL LOW (ref 1.7–2.4)

## 2017-05-19 MED ORDER — RIVAROXABAN 15 MG PO TABS
15.0000 mg | ORAL_TABLET | Freq: Two times a day (BID) | ORAL | Status: DC
  Administered 2017-05-19 – 2017-05-22 (×7): 15 mg via ORAL
  Filled 2017-05-19 (×7): qty 1

## 2017-05-19 MED ORDER — MAGNESIUM SULFATE 2 GM/50ML IV SOLN
2.0000 g | Freq: Once | INTRAVENOUS | Status: AC
  Administered 2017-05-19: 2 g via INTRAVENOUS
  Filled 2017-05-19: qty 50

## 2017-05-19 MED ORDER — PANTOPRAZOLE SODIUM 40 MG PO TBEC
40.0000 mg | DELAYED_RELEASE_TABLET | Freq: Two times a day (BID) | ORAL | Status: DC
  Administered 2017-05-19 – 2017-05-20 (×3): 40 mg via ORAL
  Filled 2017-05-19 (×3): qty 1

## 2017-05-19 MED ORDER — VANCOMYCIN 50 MG/ML ORAL SOLUTION
500.0000 mg | Freq: Four times a day (QID) | ORAL | Status: DC
  Administered 2017-05-19 – 2017-05-22 (×12): 500 mg via ORAL
  Filled 2017-05-19 (×14): qty 10

## 2017-05-19 NOTE — Progress Notes (Signed)
[facility] Progress Note  Subjective: Still having multiple stools a day and feeling weak with general malaise. Also having significant heartburn  Objective: Vital signs in last 24 hours: Temp:  [98.1 F (36.7 C)-98.7 F (37.1 C)] 98.4 F (36.9 C) (06/26 0544) Pulse Rate:  [74-98] 96 (06/26 0544) Resp:  [15-41] 20 (06/26 0544) BP: (129-177)/(54-84) 145/68 (06/26 0544) SpO2:  [95 %-100 %] 100 % (06/26 0544) Weight:  [90.7 kg (200 lb)] 90.7 kg (200 lb) (06/25 1008) Weight change:    PE: Unchanged  Lab Results: Results for orders placed or performed during the hospital encounter of 05/13/17 (from the past 24 hour(s))  Glucose, capillary     Status: Abnormal   Collection Time: 05/18/17 11:55 AM  Result Value Ref Range   Glucose-Capillary 110 (H) 65 - 99 mg/dL  Glucose, capillary     Status: Abnormal   Collection Time: 05/18/17  5:00 PM  Result Value Ref Range   Glucose-Capillary 152 (H) 65 - 99 mg/dL  Hemoglobin and hematocrit, blood     Status: Abnormal   Collection Time: 05/18/17  8:50 PM  Result Value Ref Range   Hemoglobin 11.1 (L) 12.0 - 15.0 g/dL   HCT 16.133.4 (L) 09.636.0 - 04.546.0 %  Glucose, capillary     Status: Abnormal   Collection Time: 05/18/17  8:54 PM  Result Value Ref Range   Glucose-Capillary 126 (H) 65 - 99 mg/dL  Glucose, capillary     Status: Abnormal   Collection Time: 05/19/17  7:22 AM  Result Value Ref Range   Glucose-Capillary 128 (H) 65 - 99 mg/dL  Basic metabolic panel     Status: Abnormal   Collection Time: 05/19/17  7:54 AM  Result Value Ref Range   Sodium 140 135 - 145 mmol/L   Potassium 3.6 3.5 - 5.1 mmol/L   Chloride 104 101 - 111 mmol/L   CO2 27 22 - 32 mmol/L   Glucose, Bld 117 (H) 65 - 99 mg/dL   BUN <5 (L) 6 - 20 mg/dL   Creatinine, Ser 4.090.63 0.44 - 1.00 mg/dL   Calcium 8.0 (L) 8.9 - 10.3 mg/dL   GFR calc non Af Amer >60 >60 mL/min   GFR calc Af Amer >60 >60 mL/min   Anion gap 9 5 - 15  CBC     Status: Abnormal   Collection  Time: 05/19/17  7:54 AM  Result Value Ref Range   WBC 14.5 (H) 4.0 - 10.5 K/uL   RBC 4.84 3.87 - 5.11 MIL/uL   Hemoglobin 10.4 (L) 12.0 - 15.0 g/dL   HCT 81.131.8 (L) 91.436.0 - 78.246.0 %   MCV 65.7 (L) 78.0 - 100.0 fL   MCH 21.5 (L) 26.0 - 34.0 pg   MCHC 32.7 30.0 - 36.0 g/dL   RDW 95.615.8 (H) 21.311.5 - 08.615.5 %   Platelets 615 (H) 150 - 400 K/uL  Magnesium     Status: Abnormal   Collection Time: 05/19/17  7:54 AM  Result Value Ref Range   Magnesium 1.4 (L) 1.7 - 2.4 mg/dL    Studies/Results: No results found.    Assessment: Severe C. difficile colitis  Plan: Continue vancomycin and Flagyl and florastar Due to the symptomatic reflux will add back Protonix    Antwion Carpenter C 05/19/2017, 9:58 AM  Pager [phone] If no answer or after 5 PM call [phone]

## 2017-05-19 NOTE — Progress Notes (Signed)
ANTICOAGULATION CONSULT NOTE   Pharmacy Consult for enoxaparin >> Xarelto Indication: VTE treatment  No Known Allergies  Patient Measurements: Height: 5\' 4"  (162.6 cm) Weight: 200 lb (90.7 kg) IBW/kg (Calculated) : 54.7   Vital Signs: Temp: 98.4 F (36.9 C) (06/26 0544) Temp Source: Oral (06/26 0544) BP: 145/68 (06/26 0544) Pulse Rate: 96 (06/26 0544)  Labs:  Recent Labs  05/17/17 0658 05/17/17 0958 05/18/17 0741 05/18/17 2050 05/19/17 0754  HGB 10.5*  --  11.1* 11.1* 10.4*  HCT 31.6*  --  32.7* 33.4* 31.8*  PLT 610*  --  603*  --  615*  CREATININE 0.63  --  0.58  --  0.63  TROPONINI  --  <0.03  --   --   --     Estimated Creatinine Clearance: 65.3 mL/min (by C-G formula based on SCr of 0.63 mg/dL).   Assessment: [age] year female with HTN and DM2 admitted for colitis and found to be C diff positive. Due to concomitant pain and swelling of LLE, US was performed which revealed acute DVT of L peroneal veins. Pt initially started on enoxaparin on 6/22 then switched to xarelto on 6/23 only received 1 dose, was stopped due to GI bleeding.  Per GI may resume xarelto today.  Today, 05/19/2017:  CBC: Hgb low but stable, Plt elevated  Baseline INR wnl  CrCl wnl  Drug interactions: Patient is OFF ASA/Plavix  Goal of Therapy:  Prevent VTE recurrence/worsening  Plan:   Begin Xarelto 15 mg BID with meals x 21 days  After 21 days, transition to Xarelto 20 mg daily with food  Will check CBC/SCr at least q72 hr while inpatient  F/u for s/s bleeding or worsening thrombosis   Arley PhenixEllen Tage Feggins RPh 05/19/2017, 10:43 AM Pager [phone]

## 2017-05-19 NOTE — Care Management Important Message (Signed)
Important Message  Patient Details  Name: Shirley Collins MRN: [id number] Date of Birth: 03/17/1941   Medicare Important Message Given:  Yes    Caren MacadamFuller, Lyrica Mcclarty 05/19/2017, 11:11 AMImportant Message  Patient Details  Name: Shirley Collins MRN: [id number] Date of Birth: 07/19/1941   Medicare Important Message Given:  Yes    Caren MacadamFuller, Rowene Suto 05/19/2017, 11:10 AM

## 2017-05-19 NOTE — Progress Notes (Addendum)
Patient ID: Shirley Collins, female   DOB: 1941-07-08, 76 y.o.   MRN: [id number]  PROGRESS NOTE    Shirley Collins  WUJ:[id number] DOB: June 01, 1941 DOA: 05/13/2017 PCP: Brenton Grills   Brief Narrative:  76 year female with history of hypertension and diabetes mellitus type 2 presented to the hospital with complaints of diarrhea and generalized weakness progressively getting worse. She was found to have diffuse colitis on CAT scan of the abdomen and pelvis and C. difficile was found to be positive. She was switched to oral vancomycin after initial ciprofloxacin and Flagyl intravenous treatment. She is still having a lot of diarrhea. She was found to have left lower extremity DVT for which she started on therapeutic Lovenox which was switched to oral Xarelto. She started having rectal bleeding; antiplatelets and anticoagulants were discontinued. Patient had colonoscopy done yesterday and intravenous Flagyl was added by GI. She is still having significant diarrhea.  Assessment & Plan:   Principal Problem:   Colitis Active Problems:   Hyponatremia   Hypokalemia   Type 2 diabetes mellitus without complication (HCC)   AKI (acute kidney injury) (HCC)   Dehydration   Rectal bleeding  Rectal bleeding Probably from a combination of severe colitis and anticoagulation and antiplatelet use. Aspirin and Xarelto are on hold.  Hemoglobin stable. Plavix was already discontinued. - Status post colonoscopy yesterday. Follow-up further GI recommendations. Spoke to Dr. Madilyn Fireman on phone and he is ok to resume Xarelto from today.  Probable acute C. difficile Colitis - Still having significant diarrhea. Status post colonoscopy done yesterday - Increase oral vancomycin to 500 mg by mouth every 6 hours along with IV Flagyl as recommended by GI. This is her first episode of C. difficile colitis.  -Stool PCR is positive for enteropathogenic Escherichia coli. Status post 3 days of Rocephin - Protonix has been resumed by  GI for heartburn  Acute Left peroneal vein DVT Was started on Lovenox therapeutic dosing on 06/22/2018and switched to oral Xarelto on 05/13/2017. Anticoagulation is on hold for now - Resume Xarelto today.  Acute kidney injury -Resolved  Hypokalemia  - Probably secondary to diarrhea -Resolved  Hypomagnesemia Replace and repeat a.m. labs  Hyponatremia - Resolved;likely secondary to dehydration and diarrhea  Diabetes mellitus type 2 with hyperglycemia -Hold oral hypoglycemic agents;SSI  HTN -Hold diuretics, continue atenolol to avoid rebound tachycardia. Monitor blood pressure  Leukocytosis Probably secondary to C. difficile colitis. Improving. Repeat a.m. Labs  Thrombocytosis Probably reactive. Repeat a.m. labs  DVT prophylaxis:Xarelto to resume today Code Status:Full code Family Communication:Discussed with daughter present at bedside Disposition Plan:Home in 1-3 days oncediarrhea improves  Consultants:GI  Procedures: Bilateral lower extremity duplex ultrasound on 05/15/2017: Summary: There is evidence of acute deep vein thrombosis involving the peroneal veins of the left lower extremity.  There is no evidence of superficial vein thrombosis involving the left lower extremity. There is no evidence of deep or superficial vein thrombosis involving the right lower extremity. There is no evidence of a Baker&'s cyst bilaterally.  Colonoscopy: On 05/18/2017; Preparation of the colon was fair. - Altered vascular, congested, inflamed and pseudopolypoid mucosa in the rectum, in the recto-sigmoid colon, in the sigmoid colon, in the descending colon and in the distal transverse colon. Biopsied.  Antimicrobials: Oral vancomycin from 05/13/2017 Ciprofloxacin and Flagyl on 05/13/2017 and discontinued on the same day; IV Flagyl restarted on 05/18/2017 Zithromax from 05/15/2017-05/16/2017 Rocephin from 05/16/2017-05/18/2017  Subjective: Patient seen  and examined at bedside. She still having a lot of diarrhea with  intermittent bleeding. She also complains of heartburn. No overnight fever, vomiting   Objective: Vitals:   05/18/17 1100 05/18/17 1425 05/18/17 2049 05/19/17 0544  BP: (!) 152/71 (!) 141/59 (!) 149/54 (!) 145/68  Pulse: 74 98 87 96  Resp: (!) 21 20 20 20   Temp:  98.7 F (37.1 C) 98.4 F (36.9 C) 98.4 F (36.9 C)  TempSrc:  Oral Oral Oral  SpO2: 100% 95% 100% 100%  Weight:      Height:        Intake/Output Summary (Last 24 hours) at 05/19/17 1028 Last data filed at 05/18/17 1836  Gross per 24 hour  Intake          3878.75 ml  Output                0 ml  Net          3878.75 ml   Filed Weights   05/13/17 1600 05/18/17 1008  Weight: 90.7 kg (200 lb) 90.7 kg (200 lb)    Examination:  General exam: Appears calm and comfortable  Respiratory system: Bilateral decreased breath sound at bases with scattered crackles. Cardiovascular system: S1 & S2 heard, rate controlled Gastrointestinal system: Abdomen is nondistended, soft and mildly tender in the epigastric and periumbilical regions. Normal bowel sounds heard. Central nervous system: Alert and oriented. No focal neurological deficits. Moving extremities Extremities: No cyanosis, clubbing, edema  Skin: No rashes, lesions or ulcers Lymph: No cervical lymphadenopathy    Data Reviewed: I have personally reviewed following labs and imaging studies  CBC:  Recent Labs Lab 05/15/17 0720 05/16/17 0631 05/17/17 0658 05/18/17 0741 05/18/17 2050 05/19/17 0754  WBC 12.5* 11.7* 13.2* 15.5*  --  14.5*  HGB 10.8* 10.6* 10.5* 11.1* 11.1* 10.4*  HCT 31.5* 31.7* 31.6* 32.7* 33.4* 31.8*  MCV 63.9* 64.4* 65.0* 65.1*  --  65.7*  PLT 543* 588* 610* 603*  --  615*   Basic Metabolic Panel:  Recent Labs Lab 05/15/17 0720 05/16/17 0631 05/17/17 0658 05/18/17 0741 05/19/17 0754  NA 135 137 138 138 140  K 3.6 4.0 3.9 4.4 3.6  CL 105 106 108 105 104  CO2 22 21* 23  24 27   GLUCOSE 131* 119* 112* 127* 117*  BUN 20 13 9  <5* <5*  CREATININE 0.86 0.76 0.63 0.58 0.63  CALCIUM 8.3* 8.1* 8.1* 8.0* 8.0*  MG 2.2 1.7 1.4* 1.3* 1.4*   GFR: Estimated Creatinine Clearance: 65.3 mL/min (by C-G formula based on SCr of 0.63 mg/dL). Liver Function Tests:  Recent Labs Lab 05/13/17 0945  AST 45*  ALT 32  ALKPHOS 75  BILITOT 0.9  PROT 6.6  ALBUMIN 2.4*    Recent Labs Lab 05/13/17 0945  LIPASE 33   No results for input(s): AMMONIA in the last 168 hours. Coagulation Profile:  Recent Labs Lab 05/13/17 1424  INR 1.15   Cardiac Enzymes:  Recent Labs Lab 05/17/17 0958  TROPONINI <0.03   BNP (last 3 results) No results for input(s): PROBNP in the last 8760 hours. HbA1C: No results for input(s): HGBA1C in the last 72 hours. CBG:  Recent Labs Lab 05/18/17 0744 05/18/17 1155 05/18/17 1700 05/18/17 2054 05/19/17 0722  GLUCAP 121* 110* 152* 126* 128*   Lipid Profile: No results for input(s): CHOL, HDL, LDLCALC, TRIG, CHOLHDL, LDLDIRECT in the last 72 hours. Thyroid Function Tests: No results for input(s): TSH, T4TOTAL, FREET4, T3FREE, THYROIDAB in the last 72 hours. Anemia Panel: No results for input(s): VITAMINB12, FOLATE, FERRITIN, TIBC, IRON,  RETICCTPCT in the last 72 hours. Sepsis Labs: No results for input(s): PROCALCITON, LATICACIDVEN in the last 168 hours.  Recent Results (from the past 240 hour(s))  C difficile quick scan w PCR reflex     Status: Abnormal   Collection Time: 05/13/17  3:23 PM  Result Value Ref Range Status   C Diff antigen POSITIVE (A) NEGATIVE Final   C Diff toxin POSITIVE (A) NEGATIVE Final   C Diff interpretation Toxin producing C. difficile detected.  Final    Comment: CRITICAL RESULT CALLED TO, READ BACK BY AND VERIFIED WITH: D.BLOCK RN AT 1828 ON 05/13/17 BY S.VANHOORNE   Gastrointestinal Panel by PCR , Stool     Status: Abnormal   Collection Time: 05/13/17  3:23 PM  Result Value Ref Range Status    Campylobacter species NOT DETECTED NOT DETECTED Final   Plesimonas shigelloides NOT DETECTED NOT DETECTED Final   Salmonella species NOT DETECTED NOT DETECTED Final   Yersinia enterocolitica NOT DETECTED NOT DETECTED Final   Vibrio species NOT DETECTED NOT DETECTED Final   Vibrio cholerae NOT DETECTED NOT DETECTED Final   Enteroaggregative E coli (EAEC) NOT DETECTED NOT DETECTED Final   Enteropathogenic E coli (EPEC) DETECTED (A) NOT DETECTED Final    Comment: RESULT CALLED TO, READ BACK BY AND VERIFIED WITH: DAVID BLOCK AT 1409 ON 05/14/2017 JJB    Enterotoxigenic E coli (ETEC) NOT DETECTED NOT DETECTED Final   Shiga like toxin producing E coli (STEC) NOT DETECTED NOT DETECTED Final   Shigella/Enteroinvasive E coli (EIEC) NOT DETECTED NOT DETECTED Final   Cryptosporidium NOT DETECTED NOT DETECTED Final   Cyclospora cayetanensis NOT DETECTED NOT DETECTED Final   Entamoeba histolytica NOT DETECTED NOT DETECTED Final   Giardia lamblia NOT DETECTED NOT DETECTED Final   Adenovirus F40/41 NOT DETECTED NOT DETECTED Final   Astrovirus NOT DETECTED NOT DETECTED Final   Norovirus GI/GII NOT DETECTED NOT DETECTED Final   Rotavirus A NOT DETECTED NOT DETECTED Final   Sapovirus (I, II, IV, and V) NOT DETECTED NOT DETECTED Final         Radiology Studies: No results found.      Scheduled Meds: . atenolol  25 mg Oral Daily  . chlorhexidine  15 mL Mouth Rinse BID  . escitalopram  10 mg Oral Daily  . insulin aspart  0-9 Units Subcutaneous TID WC  . mouth rinse  15 mL Mouth Rinse q12n4p  . pantoprazole  40 mg Oral BID  . saccharomyces boulardii  250 mg Oral BID  . vancomycin  500 mg Oral Q6H   Continuous Infusions: . sodium chloride 75 mL/hr at 05/19/17 0317  . metronidazole Stopped (05/19/17 0417)     LOS: 6 days        Glade Lloyd, MD [facility] Pager [phone]  If 7PM-7AM, please contact night-coverage [email or web address] Password [facility] 05/19/2017, 10:28 AM

## 2017-05-19 NOTE — Progress Notes (Signed)
Pt started having increase in bloody stools after starting back on xarelto at 1145. Pt notes more blood with each stool.  Zachary Lovins W Naji Mehringer, RN

## 2017-05-20 DIAGNOSIS — I10 Essential (primary) hypertension: Secondary | ICD-10-CM | POA: Diagnosis present

## 2017-05-20 DIAGNOSIS — I82402 Acute embolism and thrombosis of unspecified deep veins of left lower extremity: Secondary | ICD-10-CM

## 2017-05-20 DIAGNOSIS — Z87891 Personal history of nicotine dependence: Secondary | ICD-10-CM

## 2017-05-20 DIAGNOSIS — K219 Gastro-esophageal reflux disease without esophagitis: Secondary | ICD-10-CM | POA: Diagnosis present

## 2017-05-20 DIAGNOSIS — D509 Iron deficiency anemia, unspecified: Secondary | ICD-10-CM | POA: Diagnosis present

## 2017-05-20 DIAGNOSIS — O2230 Deep phlebothrombosis in pregnancy, unspecified trimester: Secondary | ICD-10-CM | POA: Diagnosis present

## 2017-05-20 DIAGNOSIS — A040 Enteropathogenic Escherichia coli infection: Secondary | ICD-10-CM | POA: Diagnosis present

## 2017-05-20 DIAGNOSIS — A0472 Enterocolitis due to Clostridium difficile, not specified as recurrent: Principal | ICD-10-CM

## 2017-05-20 LAB — GLUCOSE, CAPILLARY
GLUCOSE-CAPILLARY: 133 mg/dL — AB (ref 65–99)
GLUCOSE-CAPILLARY: 137 mg/dL — AB (ref 65–99)
GLUCOSE-CAPILLARY: 138 mg/dL — AB (ref 65–99)

## 2017-05-20 LAB — BASIC METABOLIC PANEL
Anion gap: 8 (ref 5–15)
BUN: <5 mg/dL — ABNORMAL LOW (ref 6–20)
CHLORIDE: 104 mmol/L (ref 101–111)
CO2: 26 mmol/L (ref 22–32)
Calcium: 8 mg/dL — ABNORMAL LOW (ref 8.9–10.3)
Creatinine, Ser: 0.63 mg/dL (ref 0.44–1.00)
GFR calc Af Amer: >60 mL/min (ref >60)
GFR calc non Af Amer: >60 mL/min (ref >60)
Glucose, Bld: 116 mg/dL — ABNORMAL HIGH (ref 65–99)
POTASSIUM: 3.3 mmol/L — AB (ref 3.5–5.1)
Sodium: 138 mmol/L (ref 135–145)

## 2017-05-20 LAB — CBC WITH DIFFERENTIAL/PLATELET
BASOS ABS: 0 10*3/uL (ref 0.0–0.1)
BASOS PCT: 0 %
Eosinophils Absolute: 0.2 10*3/uL (ref 0.0–0.7)
Eosinophils Relative: 2 %
HEMATOCRIT: 31 % — AB (ref 36.0–46.0)
HEMOGLOBIN: 10.2 g/dL — AB (ref 12.0–15.0)
LYMPHS PCT: 45 %
Lymphs Abs: 5.4 10*3/uL — ABNORMAL HIGH (ref 0.7–4.0)
MCH: 21.7 pg — ABNORMAL LOW (ref 26.0–34.0)
MCHC: 32.9 g/dL (ref 30.0–36.0)
MCV: 66 fL — ABNORMAL LOW (ref 78.0–100.0)
MONOS PCT: 8 %
Monocytes Absolute: 1 10*3/uL (ref 0.1–1.0)
NEUTROS ABS: 5.3 10*3/uL (ref 1.7–7.7)
NEUTROS PCT: 45 %
Platelets: 558 10*3/uL — ABNORMAL HIGH (ref 150–400)
RBC: 4.7 MIL/uL (ref 3.87–5.11)
RDW: 15.9 % — ABNORMAL HIGH (ref 11.5–15.5)
WBC: 11.9 10*3/uL — ABNORMAL HIGH (ref 4.0–10.5)

## 2017-05-20 LAB — MAGNESIUM: MAGNESIUM: 1.6 mg/dL — AB (ref 1.7–2.4)

## 2017-05-20 MED ORDER — LORAZEPAM 1 MG PO TABS: 1.0000 mg | ORAL_TABLET | Freq: Once | ORAL | Status: DC

## 2017-05-20 MED ORDER — MAGNESIUM SULFATE 2 GM/50ML IV SOLN
2.0000 g | Freq: Once | INTRAVENOUS | Status: AC
  Administered 2017-05-20: 2 g via INTRAVENOUS
  Filled 2017-05-20: qty 50

## 2017-05-20 MED ORDER — POTASSIUM CHLORIDE CRYS ER 20 MEQ PO TBCR
40.0000 meq | EXTENDED_RELEASE_TABLET | ORAL | Status: AC
  Administered 2017-05-20 (×2): 40 meq via ORAL
  Filled 2017-05-20 (×2): qty 2

## 2017-05-20 NOTE — Progress Notes (Signed)
[facility] Progress Note  Subjective: Finally noticing decrease in diarrhea but some increase in rectal bleeding. Advance to soft diet today and tolerated lunch okay. WBC count down to 11.9 today.  Objective: Vital signs in last 24 hours: Temp:  [97.9 F (36.6 C)-98.5 F (36.9 C)] 97.9 F (36.6 C) (06/27 1455) Pulse Rate:  [65-68] 65 (06/27 1455) Resp:  [20] 20 (06/27 1455) BP: (138-159)/(46-70) 138/46 (06/27 1455) SpO2:  [97 %-99 %] 97 % (06/27 1455) Weight change:    PE: Unchanged. Seems a little more upbeat.  Lab Results: Results for orders placed or performed during the hospital encounter of 05/13/17 (from the past 24 hour(s))  Glucose, capillary     Status: None   Collection Time: 05/19/17  5:27 PM  Result Value Ref Range   Glucose-Capillary 88 65 - 99 mg/dL  Glucose, capillary     Status: Abnormal   Collection Time: 05/19/17  8:24 PM  Result Value Ref Range   Glucose-Capillary 123 (H) 65 - 99 mg/dL  CBC with Differential/Platelet     Status: Abnormal   Collection Time: 05/20/17  5:14 AM  Result Value Ref Range   WBC 11.9 (H) 4.0 - 10.5 K/uL   RBC 4.70 3.87 - 5.11 MIL/uL   Hemoglobin 10.2 (L) 12.0 - 15.0 g/dL   HCT 16.1 (L) 09.6 - 04.5 %   MCV 66.0 (L) 78.0 - 100.0 fL   MCH 21.7 (L) 26.0 - 34.0 pg   MCHC 32.9 30.0 - 36.0 g/dL   RDW 40.9 (H) 81.1 - 91.4 %   Platelets 558 (H) 150 - 400 K/uL   Neutrophils Relative % 45 %   Lymphocytes Relative 45 %   Monocytes Relative 8 %   Eosinophils Relative 2 %   Basophils Relative 0 %   Neutro Abs 5.3 1.7 - 7.7 K/uL   Lymphs Abs 5.4 (H) 0.7 - 4.0 K/uL   Monocytes Absolute 1.0 0.1 - 1.0 K/uL   Eosinophils Absolute 0.2 0.0 - 0.7 K/uL   Basophils Absolute 0.0 0.0 - 0.1 K/uL   RBC Morphology POLYCHROMASIA PRESENT   Basic metabolic panel     Status: Abnormal   Collection Time: 05/20/17  5:14 AM  Result Value Ref Range   Sodium 138 135 - 145 mmol/L   Potassium 3.3 (L) 3.5 - 5.1 mmol/L   Chloride 104 101 - 111  mmol/L   CO2 26 22 - 32 mmol/L   Glucose, Bld 116 (H) 65 - 99 mg/dL   BUN <5 (L) 6 - 20 mg/dL   Creatinine, Ser 7.82 0.44 - 1.00 mg/dL   Calcium 8.0 (L) 8.9 - 10.3 mg/dL   GFR calc non Af Amer >60 >60 mL/min   GFR calc Af Amer >60 >60 mL/min   Anion gap 8 5 - 15  Magnesium     Status: Abnormal   Collection Time: 05/20/17  5:14 AM  Result Value Ref Range   Magnesium 1.6 (L) 1.7 - 2.4 mg/dL  Glucose, capillary     Status: Abnormal   Collection Time: 05/20/17  7:10 AM  Result Value Ref Range   Glucose-Capillary 133 (H) 65 - 99 mg/dL  Glucose, capillary     Status: Abnormal   Collection Time: 05/20/17 11:34 AM  Result Value Ref Range   Glucose-Capillary 137 (H) 65 - 99 mg/dL    Studies/Results: No results found.    Assessment: C. difficile colitis with both transverse and more friable less classic rectal biopsies both histologically consistent  with pseudomembranous colitis.  Plan: Continue vancomycin and Flagyl and florastar. Soft diet as tolerated GI will Follow-up in 2 days.    Shirley Collins C 05/20/2017, 3:51 PM  Pager [phone] If no answer or after 5 PM call [phone]

## 2017-05-20 NOTE — Progress Notes (Signed)
Patient ID: Shirley CreedClara Collins, female   DOB: 07/16/1941, 76 y.o.   MRN: [id number]  PROGRESS NOTE    Shirley Collins  [id number] DOB: 11/03/1941 DOA: 05/13/2017 PCP: Brenton GrillsMercier, Randall R   Brief Narrative:  [age] year female with history of hypertension and diabetes mellitus type 2 presented to the hospital with complaints of diarrhea and generalized weakness progressively getting worse. She was found to have diffuse colitis on CAT scan of the abdomen and pelvis and C. difficile was found to be positive. She was switched to oral vancomycin after initial ciprofloxacin and Flagyl intravenous treatment. She is still having a lot of diarrhea. She was found to have left lower extremity DVT for which she started on therapeutic Lovenox which was switched to oral Xarelto. She started having rectal bleeding; antiplatelets and anticoagulants were discontinued. Patient had colonoscopy done yesterday and intravenous Flagyl was added by GI. She is still having significant diarrhea.  Assessment & Plan:   Principal Problem:   Colitis Active Problems:   Hyponatremia   Hypokalemia   Type 2 diabetes mellitus without complication (HCC)   AKI (acute kidney injury) (HCC)   Dehydration   Rectal bleeding  Rectal bleeding -Probably from a combination of severe colitis and anticoagulation and antiplatelet use. Aspirin/plavix  and Xarelto held since admission, xatelto resumed on 6/26  -daughter report seeing more blood after xarelto resumption, close monitor hgb, hgb10.4 on 6/26, 10.2 o n6/27..  - Status post flex sigmoidoscope on 6/25,  Per GI Dr Madilyn FiremanHayes: "Extended sigmoidoscopy to about the distal transverse colon shows typical to almost classic pseudomembranes with some severe friability and erythema in the distal rectum. This is clearly C. difficile colitis, cannot rule out a superimposed IBD proctitis"   -gi oked to resume xarelto on 6/26 -now tolerating diet, d/c ivf  acute C. difficile Colitis/severe - Increase oral  vancomycin to 500 mg by mouth every 6 hours along with IV Flagyl as recommended by GI. Probiotics, This is her first episode of C. difficile colitis.  -Stool PCR is positive for enteropathogenic Escherichia coli. Status post 3 days of Rocephin - gi and infectious disease consulted, will follow recommendations  Acute Left peroneal vein DVT -Was started on Lovenox therapeutic dosing on 06/22/2018and switched to oral Xarelto on 05/13/2017. Anticoagulation  Held for a few days due to rectal bleeding - Resume Xarelto on 6/26, monitor hgb  Acute kidney injury -Cr 1.48 on admission -cr normalized, Resolved  Hypokalemia /Hypomagnesemia - Probably secondary to diarrhea -continue to replace   Hyponatremia Na 129 on admission,  - Resolved;likely secondary to dehydration and diarrhea  Diabetes mellitus type 2 with hyperglycemia a1c 6.2 -Hold oral hypoglycemic agents;SSI  HTN -Hold diuretics, continue atenolol to avoid rebound tachycardia. Monitor blood pressure  Leukocytosis Probably secondary to C. difficile colitis. Improving. Repeat a.m. Labs  Thrombocytosis Probably reactive. Repeat a.m. Labs  Metabolic encephalopathy with baseline mild memory impairment Daughter report patient is getting more confused She is pleasantly confused, oriented to person only,  Frequent reorientation  DVT prophylaxis:Xarelto resumed on 6/26 Code Status:Full code Family Communication:Discussed with daughter present at bedside Disposition Plan:Home once diarrhea improved, with gi and id clearance  Consultants: GI Infectious disease  Procedures: Bilateral lower extremity duplex ultrasound on 05/15/2017: Summary: There is evidence of acute deep vein thrombosis involving the peroneal veins of the left lower extremity.  There is no evidence of superficial vein thrombosis involving the left lower extremity. There is no evidence of deep or superficial vein  thrombosis involving the right  lower extremity. There is no evidence of a Baker&'s cyst bilaterally.  Colonoscopy: On 05/18/2017; Preparation of the colon was fair. - Altered vascular, congested, inflamed and pseudopolypoid mucosa in the rectum, in the recto-sigmoid colon, in the sigmoid colon, in the descending colon and in the distal transverse colon. Biopsied.  Antimicrobials: Oral vancomycin from 05/13/2017 Ciprofloxacin and Flagyl on 05/13/2017 and discontinued on the same day; IV Flagyl restarted on 05/18/2017 Zithromax from 05/15/2017-05/16/2017 Rocephin from 05/16/2017-05/18/2017  Subjective: Patient seen and examined at bedside.  She still having a lot of diarrhea with intermittent bleeding.  She is pleasantly confused, denies pain, No overnight fever, vomiting She is tolerating diet, daughter at bedside   Objective: Vitals:   05/19/17 0544 05/19/17 1439 05/19/17 2025 05/20/17 0528  BP: (!) 145/68 (!) 155/54 (!) 156/70 (!) 159/62  Pulse: 96 64 68 68  Resp: 20 20 20 20   Temp: 98.4 F (36.9 C) 98.3 F (36.8 C) 98.5 F (36.9 C) 98 F (36.7 C)  TempSrc: Oral Oral Oral Oral  SpO2: 100% 100% 98% 99%  Weight:      Height:        Intake/Output Summary (Last 24 hours) at 05/20/17 0817 Last data filed at 05/19/17 1916  Gross per 24 hour  Intake          1706.25 ml  Output                0 ml  Net          1706.25 ml   Filed Weights   05/13/17 1600 05/18/17 1008  Weight: 90.7 kg (200 lb) 90.7 kg (200 lb)    Examination:  General exam: Appears calm and comfortable , pleasantly confused, oriented to person only Respiratory system: Bilateral decreased breath sound at bases with scattered crackles. Cardiovascular system: S1 & S2 heard, rate controlled Gastrointestinal system: Abdomen is nondistended, soft and mildly tender in the epigastric and periumbilical regions. Normal bowel sounds heard. Central nervous system: Alert and oriented. No focal neurological  deficits. Moving extremities Extremities: No cyanosis, clubbing, trace pitting edema bilateral lower extremity  Skin: No rashes, lesions or ulcers Lymph: No cervical lymphadenopathy    Data Reviewed: I have personally reviewed following labs and imaging studies  CBC:  Recent Labs Lab 05/16/17 0631 05/17/17 0658 05/18/17 0741 05/18/17 2050 05/19/17 0754 05/20/17 0514  WBC 11.7* 13.2* 15.5*  --  14.5* 11.9*  NEUTROABS  --   --   --   --   --  5.3  HGB 10.6* 10.5* 11.1* 11.1* 10.4* 10.2*  HCT 31.7* 31.6* 32.7* 33.4* 31.8* 31.0*  MCV 64.4* 65.0* 65.1*  --  65.7* 66.0*  PLT 588* 610* 603*  --  615* 558*   Basic Metabolic Panel:  Recent Labs Lab 05/16/17 0631 05/17/17 0658 05/18/17 0741 05/19/17 0754 05/20/17 0514  NA 137 138 138 140 138  K 4.0 3.9 4.4 3.6 3.3*  CL 106 108 105 104 104  CO2 21* 23 24 27 26   GLUCOSE 119* 112* 127* 117* 116*  BUN 13 9 <5* <5* <5*  CREATININE 0.76 0.63 0.58 0.63 0.63  CALCIUM 8.1* 8.1* 8.0* 8.0* 8.0*  MG 1.7 1.4* 1.3* 1.4* 1.6*   GFR: Estimated Creatinine Clearance: 65.3 mL/min (by C-G formula based on SCr of 0.63 mg/dL). Liver Function Tests:  Recent Labs Lab 05/13/17 0945  AST 45*  ALT 32  ALKPHOS 75  BILITOT 0.9  PROT 6.6  ALBUMIN 2.4*    Recent Labs Lab 05/13/17  0945  LIPASE 33   No results for input(s): AMMONIA in the last 168 hours. Coagulation Profile:  Recent Labs Lab 05/13/17 1424  INR 1.15   Cardiac Enzymes:  Recent Labs Lab 05/17/17 0958  TROPONINI <0.03   BNP (last 3 results) No results for input(s): PROBNP in the last 8760 hours. HbA1C: No results for input(s): HGBA1C in the last 72 hours. CBG:  Recent Labs Lab 05/19/17 0722 05/19/17 1155 05/19/17 1727 05/19/17 2024 05/20/17 0710  GLUCAP 128* 122* 88 123* 133*   Lipid Profile: No results for input(s): CHOL, HDL, LDLCALC, TRIG, CHOLHDL, LDLDIRECT in the last 72 hours. Thyroid Function Tests: No results for input(s): TSH, T4TOTAL,  FREET4, T3FREE, THYROIDAB in the last 72 hours. Anemia Panel: No results for input(s): VITAMINB12, FOLATE, FERRITIN, TIBC, IRON, RETICCTPCT in the last 72 hours. Sepsis Labs: No results for input(s): PROCALCITON, LATICACIDVEN in the last 168 hours.  Recent Results (from the past 240 hour(s))  C difficile quick scan w PCR reflex     Status: Abnormal   Collection Time: 05/13/17  3:23 PM  Result Value Ref Range Status   C Diff antigen POSITIVE (A) NEGATIVE Final   C Diff toxin POSITIVE (A) NEGATIVE Final   C Diff interpretation Toxin producing C. difficile detected.  Final    Comment: CRITICAL RESULT CALLED TO, READ BACK BY AND VERIFIED WITH: D.BLOCK RN AT 1828 ON 05/13/17 BY S.VANHOORNE   Gastrointestinal Panel by PCR , Stool     Status: Abnormal   Collection Time: 05/13/17  3:23 PM  Result Value Ref Range Status   Campylobacter species NOT DETECTED NOT DETECTED Final   Plesimonas shigelloides NOT DETECTED NOT DETECTED Final   Salmonella species NOT DETECTED NOT DETECTED Final   Yersinia enterocolitica NOT DETECTED NOT DETECTED Final   Vibrio species NOT DETECTED NOT DETECTED Final   Vibrio cholerae NOT DETECTED NOT DETECTED Final   Enteroaggregative E coli (EAEC) NOT DETECTED NOT DETECTED Final   Enteropathogenic E coli (EPEC) DETECTED (A) NOT DETECTED Final    Comment: RESULT CALLED TO, READ BACK BY AND VERIFIED WITH: DAVID BLOCK AT 1409 ON 05/14/2017 JJB    Enterotoxigenic E coli (ETEC) NOT DETECTED NOT DETECTED Final   Shiga like toxin producing E coli (STEC) NOT DETECTED NOT DETECTED Final   Shigella/Enteroinvasive E coli (EIEC) NOT DETECTED NOT DETECTED Final   Cryptosporidium NOT DETECTED NOT DETECTED Final   Cyclospora cayetanensis NOT DETECTED NOT DETECTED Final   Entamoeba histolytica NOT DETECTED NOT DETECTED Final   Giardia lamblia NOT DETECTED NOT DETECTED Final   Adenovirus F40/41 NOT DETECTED NOT DETECTED Final   Astrovirus NOT DETECTED NOT DETECTED Final   Norovirus  GI/GII NOT DETECTED NOT DETECTED Final   Rotavirus A NOT DETECTED NOT DETECTED Final   Sapovirus (I, II, IV, and V) NOT DETECTED NOT DETECTED Final         Radiology Studies: No results found.      Scheduled Meds: . atenolol  25 mg Oral Daily  . chlorhexidine  15 mL Mouth Rinse BID  . escitalopram  10 mg Oral Daily  . insulin aspart  0-9 Units Subcutaneous TID WC  . mouth rinse  15 mL Mouth Rinse q12n4p  . pantoprazole  40 mg Oral BID  . potassium chloride  40 mEq Oral Q4H  . Rivaroxaban  15 mg Oral BID WC  . saccharomyces boulardii  250 mg Oral BID  . vancomycin  500 mg Oral Q6H   Continuous Infusions: .  sodium chloride 1,000 mL (05/20/17 0108)  . magnesium sulfate 1 - 4 g bolus IVPB    . metronidazole Stopped (05/20/17 0549)     LOS: 7 days     Time spent: >80mins   Angella Montas, MD PhD [facility] Pager [phone]  If 7PM-7AM, please contact night-coverage [email or web address] Password [facility] 05/20/2017, 8:17 AM

## 2017-05-20 NOTE — Consult Note (Signed)
[facility]    Date of Admission:  05/13/2017           Day 8 oral vancomycin        Day 3 IV metronidazole       Reason for Consult: Colitis    Referring Provider: Dr. Vella RedheadFeng Xu  Assessment: I suspect that C. difficile colitis is the primary culprit here. Her GI bleeding appears temporally associated with anticoagulation rather than being caused from infection. Enteropathogenic Escherichia coli enteritis usually does not require therapy. Certainly the treatment that she is already received should be sufficient and I would prefer having her off systemic antibiotics in order to help cure her C. difficile colitis. She needs to stay on high-dose oral vancomycin. She is not having any evidence of significant ileus, nausea or vomiting. She has a very metallic taste in her mouth that is probably due to the metronidazole so I will stop it now. I talked to her and her daughter about the possible association of proton pump inhibitors and increased risk for C. difficile infection. They would like to have her stop it again. The daughter feels like she will be okay if she can advance her diet.   Plan: 1. Continue oral vancomycin 2. Discontinue metronidazole and proton pump inhibitors   Principal Problem:   C. difficile colitis Active Problems:   Enteritis, enteropathogenic E. coli   Rectal bleeding   GERD (gastroesophageal reflux disease)   Hyponatremia   Hypokalemia   Type 2 diabetes mellitus without complication (HCC)   AKI (acute kidney injury) (HCC)   Dehydration   Microcytic anemia   HTN (hypertension)   DVT (deep vein thrombosis) in pregnancy (HCC)   . atenolol  25 mg Oral Daily  . chlorhexidine  15 mL Mouth Rinse BID  . escitalopram  10 mg Oral Daily  . insulin aspart  0-9 Units Subcutaneous TID WC  . mouth rinse  15 mL Mouth Rinse q12n4p  . Rivaroxaban  15 mg Oral BID WC  . saccharomyces boulardii  250 mg Oral BID  . vancomycin  500 mg Oral Q6H     HPI: Shirley CreedClara Koons is a 76 y.o. female who was on a course of azithromycin about one month ago. About 2 weeks ago she began to develop profuse watery diarrhea. [place] in [place] and was told that she probably had norovirus. Her daughter brought her here where she was admitted on 05/13/2017. She was found to have PCR evidence of toxigenic C. difficile infection and enteropathogenic Escherichia coli. A CT scan showed evidence of diffuse colitis involving the ascending and proximal transverse colon. She was started on oral vancomycin for C. difficile colitis. She was started on ciprofloxacin and then transitioned to ceftriaxone for possible concomitant Escherichia coli infection.  She was found to have a left leg DVT and started on anticoagulation. She then began having bloody stools. Her anticoagulation was held temporarily and the bleeding stopped but began again when anticoagulation was resumed. She is still having frequent bowel movements but her daughter states that it is beginning to get a little bit better.   Review of Systems: Review of Systems  Constitutional: Positive for malaise/fatigue. Negative for chills, diaphoresis, fever and weight loss.  HENT: Negative for congestion and sore throat.   Respiratory: Negative for cough, sputum production and shortness of breath.   Cardiovascular: Negative for chest pain.  Gastrointestinal: Positive for blood in stool,  diarrhea and heartburn. Negative for abdominal pain, nausea and vomiting.       Some intermittent heartburn. Her daughter feels like her heartburn is worse when she is on a liquid diet and better when she is able to eat solid food.  Genitourinary: Negative for dysuria.  Musculoskeletal: Negative for joint pain and myalgias.  Skin: Negative for rash.  Neurological: Negative for dizziness and headaches.    Past Medical History:  Diagnosis Date  . Anemia   . Anxiety   . Arthritis   . Asthma   .  CHF (congestive heart failure) (HCC)   . Chronic kidney disease   . Coronary artery disease   . Dementia   . Depression   . Diabetes mellitus without complication (HCC)   . GERD (gastroesophageal reflux disease)   . Hypertension   . Pneumonia   . Stroke [facility])     Social History  Substance Use Topics  . Smoking status: Former Smoker    Packs/day: 1.00    Years: 20.00    Types: Cigarettes  . Smokeless tobacco: Never Used  . Alcohol use No    History reviewed. No pertinent family history. No Known Allergies  OBJECTIVE: Blood pressure (!) 159/62, pulse 68, temperature 98 F (36.7 C), temperature source Oral, resp. rate 20, height 5\' 4"  (1.626 m), weight 200 lb (90.7 kg), SpO2 99 %.  Physical Exam  Constitutional: She is oriented to person, place, and time. No distress.  She appears comfortable resting in bed. Her daughter is present at the bedside. She would like to advance her diet.  HENT:  Mouth/Throat: No oropharyngeal exudate.  Eyes: Conjunctivae are normal.  Cardiovascular: Normal rate and regular rhythm.   No murmur heard. Pulmonary/Chest: Effort normal and breath sounds normal.  Abdominal: Soft. She exhibits no mass. There is tenderness.  Quiet bowel sounds. Very mild diffuse abdominal pain with palpation.  Musculoskeletal: Normal range of motion.  Neurological: She is alert and oriented to person, place, and time.  Skin: No rash noted.  Psychiatric: Mood and affect normal.    Lab Results Lab Results  Component Value Date   WBC 11.9 (H) 05/20/2017   HGB 10.2 (L) 05/20/2017   HCT 31.0 (L) 05/20/2017   MCV 66.0 (L) 05/20/2017   PLT 558 (H) 05/20/2017    Lab Results  Component Value Date   CREATININE 0.63 05/20/2017   BUN <5 (L) 05/20/2017   NA 138 05/20/2017   K 3.3 (L) 05/20/2017   CL 104 05/20/2017   CO2 26 05/20/2017    Lab Results  Component Value Date   ALT 32 05/13/2017   AST 45 (H) 05/13/2017   ALKPHOS 75 05/13/2017   BILITOT 0.9 05/13/2017      Microbiology: Recent Results (from the past 240 hour(s))  C difficile quick scan w PCR reflex     Status: Abnormal   Collection Time: 05/13/17  3:23 PM  Result Value Ref Range Status   C Diff antigen POSITIVE (A) NEGATIVE Final   C Diff toxin POSITIVE (A) NEGATIVE Final   C Diff interpretation Toxin producing C. difficile detected.  Final    Comment: CRITICAL RESULT CALLED TO, READ BACK BY AND VERIFIED WITH: D.BLOCK RN AT 1828 ON 05/13/17 BY S.VANHOORNE   Gastrointestinal Panel by PCR , Stool     Status: Abnormal   Collection Time: 05/13/17  3:23 PM  Result Value Ref Range Status   Campylobacter species NOT DETECTED NOT DETECTED Final   Plesimonas shigelloides NOT  DETECTED NOT DETECTED Final   Salmonella species NOT DETECTED NOT DETECTED Final   Yersinia enterocolitica NOT DETECTED NOT DETECTED Final   Vibrio species NOT DETECTED NOT DETECTED Final   Vibrio cholerae NOT DETECTED NOT DETECTED Final   Enteroaggregative E coli (EAEC) NOT DETECTED NOT DETECTED Final   Enteropathogenic E coli (EPEC) DETECTED (A) NOT DETECTED Final    Comment: RESULT CALLED TO, READ BACK BY AND VERIFIED WITH: DAVID BLOCK AT 1409 ON 05/14/2017 JJB    Enterotoxigenic E coli (ETEC) NOT DETECTED NOT DETECTED Final   Shiga like toxin producing E coli (STEC) NOT DETECTED NOT DETECTED Final   Shigella/Enteroinvasive E coli (EIEC) NOT DETECTED NOT DETECTED Final   Cryptosporidium NOT DETECTED NOT DETECTED Final   Cyclospora cayetanensis NOT DETECTED NOT DETECTED Final   Entamoeba histolytica NOT DETECTED NOT DETECTED Final   Giardia lamblia NOT DETECTED NOT DETECTED Final   Adenovirus F40/41 NOT DETECTED NOT DETECTED Final   Astrovirus NOT DETECTED NOT DETECTED Final   Norovirus GI/GII NOT DETECTED NOT DETECTED Final   Rotavirus A NOT DETECTED NOT DETECTED Final   Sapovirus (I, II, IV, and V) NOT DETECTED NOT DETECTED Final    Cliffton Asters, MD [facility] [phone] pager   [phone] cell 05/20/2017, 2:54 PM

## 2017-05-21 LAB — CBC
HEMATOCRIT: 37.3 % (ref 36.0–46.0)
HEMOGLOBIN: 12.6 g/dL (ref 12.0–15.0)
MCH: 22.3 pg — ABNORMAL LOW (ref 26.0–34.0)
MCHC: 33.8 g/dL (ref 30.0–36.0)
MCV: 66 fL — ABNORMAL LOW (ref 78.0–100.0)
PLATELETS: 681 10*3/uL — AB (ref 150–400)
RBC: 5.65 MIL/uL — AB (ref 3.87–5.11)
RDW: 16.4 % — AB (ref 11.5–15.5)
WBC: 10.5 10*3/uL (ref 4.0–10.5)

## 2017-05-21 LAB — MAGNESIUM: Magnesium: 1.7 mg/dL (ref 1.7–2.4)

## 2017-05-21 LAB — BASIC METABOLIC PANEL
ANION GAP: 9 (ref 5–15)
BUN: <5 mg/dL — ABNORMAL LOW (ref 6–20)
CALCIUM: 8.6 mg/dL — AB (ref 8.9–10.3)
CO2: 29 mmol/L (ref 22–32)
Chloride: 101 mmol/L (ref 101–111)
Creatinine, Ser: 0.78 mg/dL (ref 0.44–1.00)
GLUCOSE: 143 mg/dL — AB (ref 65–99)
Potassium: 3.8 mmol/L (ref 3.5–5.1)
Sodium: 139 mmol/L (ref 135–145)

## 2017-05-21 LAB — GLUCOSE, CAPILLARY
GLUCOSE-CAPILLARY: 122 mg/dL — AB (ref 65–99)
Glucose-Capillary: 134 mg/dL — ABNORMAL HIGH (ref 65–99)
Glucose-Capillary: 122 mg/dL — ABNORMAL HIGH (ref 65–99)
Glucose-Capillary: 200 mg/dL — ABNORMAL HIGH (ref 65–99)

## 2017-05-21 MED ORDER — CARVEDILOL 3.125 MG PO TABS
3.1250 mg | ORAL_TABLET | Freq: Two times a day (BID) | ORAL | Status: DC
  Administered 2017-05-21 – 2017-05-22 (×2): 3.125 mg via ORAL
  Filled 2017-05-21 (×2): qty 1

## 2017-05-21 MED ORDER — LORAZEPAM 0.5 MG PO TABS: 0.5000 mg | ORAL_TABLET | Freq: Every evening | ORAL | Status: DC | PRN

## 2017-05-21 MED ORDER — LISINOPRIL 5 MG PO TABS
2.5000 mg | ORAL_TABLET | Freq: Every day | ORAL | Status: DC
  Administered 2017-05-21 – 2017-05-22 (×2): 2.5 mg via ORAL
  Filled 2017-05-21 (×2): qty 1

## 2017-05-21 NOTE — Progress Notes (Signed)
Patient ID: Shirley Collins, female   DOB: 02/27/1941, 76 y.o.   MRN: [id number]          [facility]  Date of Admission:  05/13/2017           Day 9 oral vancomycin ASSESSMENT: She is beginning to improve on therapy for C. difficile colitis. Although this is only her first episode it was moderately severe so I plan on completing a standard 14 day course of therapy and then converting over to a vancomycin taper. I would recommend having her stay off proton pump inhibitors at least while on therapy with vancomycin. As far as I am concerned that she can go home at any time.  PLAN: 1. Continue current dose of oral vancomycin for 5 more days then begin the following tapering course:  Oral vancomycin 125 mg twice daily for 7 days, followed by  Oral vancomycin 125 mg once daily for 7 days, followed by  Oral vancomycin 125 mg every other day for 7 days, followed by  Oral vancomycin 125 mg every 3 days for 14 days  Principal Problem:   C. difficile colitis Active Problems:   Enteritis, enteropathogenic E. coli   Rectal bleeding   GERD (gastroesophageal reflux disease)   Hyponatremia   Hypokalemia   Type 2 diabetes mellitus without complication (HCC)   AKI (acute kidney injury) (HCC)   Dehydration   Microcytic anemia   HTN (hypertension)   DVT (deep vein thrombosis) in pregnancy (HCC)   . atenolol  25 mg Oral Daily  . chlorhexidine  15 mL Mouth Rinse BID  . escitalopram  10 mg Oral Daily  . insulin aspart  0-9 Units Subcutaneous TID WC  . LORazepam  1 mg Oral Once  . mouth rinse  15 mL Mouth Rinse q12n4p  . Rivaroxaban  15 mg Oral BID WC  . saccharomyces boulardii  250 mg Oral BID  . vancomycin  500 mg Oral Q6H    SUBJECTIVE: She is feeling much better today. She is still having diarrhea but has had only 5 stools so far today. Her appetite is improved. She's not having any nausea, vomiting or abdominal pain. She had only minor reflux symptoms after dinner  last night. She had a cheeseburger for lunch. She is eager to go home. She will be staying with her daughter hearing [place] after discharge.  Review of Systems: Review of Systems  Constitutional: Negative for chills, diaphoresis and fever.  Gastrointestinal: Positive for diarrhea and heartburn. Negative for abdominal pain, nausea and vomiting.    No Known Allergies  OBJECTIVE: Vitals:   05/20/17 1006 05/20/17 1455 05/20/17 2058 05/21/17 0632  BP:  (!) 138/46 (!) 135/43 (!) 156/65  Pulse:  65 65 73  Resp:  20 18 18   Temp:  97.9 F (36.6 C) 98.9 F (37.2 C) 97.5 F (36.4 C)  TempSrc:  Oral Oral Oral  SpO2: 99% 97% 98% 98%  Weight:      Height:       Body mass index is 34.33 kg/m.  Physical Exam  Constitutional:  She is smiling and in good spirits. Her daughter is with her.  Abdominal: Soft. She exhibits no distension. There is no tenderness.  Quiet bowel sounds.    Lab Results Lab Results  Component Value Date   WBC 10.5 05/21/2017   HGB 12.6 05/21/2017   HCT 37.3 05/21/2017   MCV 66.0 (L) 05/21/2017   PLT 681 (H) 05/21/2017    Lab  Results  Component Value Date   CREATININE 0.78 05/21/2017   BUN <5 (L) 05/21/2017   NA 139 05/21/2017   K 3.8 05/21/2017   CL 101 05/21/2017   CO2 29 05/21/2017    Lab Results  Component Value Date   ALT 32 05/13/2017   AST 45 (H) 05/13/2017   ALKPHOS 75 05/13/2017   BILITOT 0.9 05/13/2017     Microbiology: Recent Results (from the past 240 hour(s))  C difficile quick scan w PCR reflex     Status: Abnormal   Collection Time: 05/13/17  3:23 PM  Result Value Ref Range Status   C Diff antigen POSITIVE (A) NEGATIVE Final   C Diff toxin POSITIVE (A) NEGATIVE Final   C Diff interpretation Toxin producing C. difficile detected.  Final    Comment: CRITICAL RESULT CALLED TO, READ BACK BY AND VERIFIED WITH: D.BLOCK RN AT 1828 ON 05/13/17 BY S.VANHOORNE   Gastrointestinal Panel by PCR , Stool     Status: Abnormal   Collection  Time: 05/13/17  3:23 PM  Result Value Ref Range Status   Campylobacter species NOT DETECTED NOT DETECTED Final   Plesimonas shigelloides NOT DETECTED NOT DETECTED Final   Salmonella species NOT DETECTED NOT DETECTED Final   Yersinia enterocolitica NOT DETECTED NOT DETECTED Final   Vibrio species NOT DETECTED NOT DETECTED Final   Vibrio cholerae NOT DETECTED NOT DETECTED Final   Enteroaggregative E coli (EAEC) NOT DETECTED NOT DETECTED Final   Enteropathogenic E coli (EPEC) DETECTED (A) NOT DETECTED Final    Comment: RESULT CALLED TO, READ BACK BY AND VERIFIED WITH: DAVID BLOCK AT 1409 ON 05/14/2017 JJB    Enterotoxigenic E coli (ETEC) NOT DETECTED NOT DETECTED Final   Shiga like toxin producing E coli (STEC) NOT DETECTED NOT DETECTED Final   Shigella/Enteroinvasive E coli (EIEC) NOT DETECTED NOT DETECTED Final   Cryptosporidium NOT DETECTED NOT DETECTED Final   Cyclospora cayetanensis NOT DETECTED NOT DETECTED Final   Entamoeba histolytica NOT DETECTED NOT DETECTED Final   Giardia lamblia NOT DETECTED NOT DETECTED Final   Adenovirus F40/41 NOT DETECTED NOT DETECTED Final   Astrovirus NOT DETECTED NOT DETECTED Final   Norovirus GI/GII NOT DETECTED NOT DETECTED Final   Rotavirus A NOT DETECTED NOT DETECTED Final   Sapovirus (I, II, IV, and V) NOT DETECTED NOT DETECTED Final    Cliffton Asters, MD [facility] [phone] pager   [phone] cell 05/21/2017, 4:25 PM

## 2017-05-21 NOTE — Progress Notes (Signed)
PT Cancellation Note  Patient Details Name: Shirley Collins MRN: [id number] DOB: 12/14/1940   Cancelled Treatment:    Reason Eval/Treat Not Completed: PT screened, no needs identified, will sign off   [facility] Center[facility]ILLIAMS,Shirley Collins 05/21/2017, 10:59 AM

## 2017-05-21 NOTE — Progress Notes (Addendum)
Patient ID: Shirley Collins, female   DOB: [birth year], 76 y.o.   MRN: [id number]  PROGRESS NOTE    Shirley Collins  QIH:[id number] DOB: 26-Aug-1941 DOA: 05/13/2017 PCP: Brenton Grills   Brief Narrative:  76 year female with history of hypertension and diabetes mellitus type 2 presented to the hospital with complaints of diarrhea and generalized weakness progressively getting worse. She was found to have diffuse colitis on CAT scan of the abdomen and pelvis and C. difficile was found to be positive. She was switched to oral vancomycin after initial ciprofloxacin and Flagyl intravenous treatment. She is still having a lot of diarrhea. She was found to have left lower extremity DVT for which she started on therapeutic Lovenox which was switched to oral Xarelto. She started having rectal bleeding; antiplatelets and anticoagulants were discontinued. Patient had colonoscopy done yesterday and intravenous Flagyl was added by GI. She is still having significant diarrhea.  Assessment & Plan:   Principal Problem:   C. difficile colitis Active Problems:   Hyponatremia   Hypokalemia   Type 2 diabetes mellitus without complication (HCC)   AKI (acute kidney injury) (HCC)   Dehydration   Rectal bleeding   Enteritis, enteropathogenic E. coli   GERD (gastroesophageal reflux disease)   Microcytic anemia   HTN (hypertension)   DVT (deep vein thrombosis) in pregnancy (HCC)  Rectal bleeding -Probably from a combination of severe colitis and anticoagulation and antiplatelet use. Aspirin/plavix  and Xarelto held since admission, xatelto resumed on 6/26  -daughter report seeing more blood after xarelto resumption, close monitor hgb, hgb10.4 on 6/26, 10.2 o n6/27..  - Status post flex sigmoidoscope on 6/25,  Per GI Dr Madilyn Fireman: "Extended sigmoidoscopy to about the distal transverse colon shows typical to almost classic pseudomembranes with some severe friability and erythema in the distal rectum. This is clearly C.  difficile colitis, cannot rule out a superimposed IBD proctitis"   -gi oked to resume xarelto on 6/26 -now tolerating diet, d/c ivf -hgb stable  acute C. difficile Colitis/severe - Increase oral vancomycin to 500 mg by mouth every 6 hours along with IV Flagyl as recommended by GI. Probiotics, This is her first episode of C. difficile colitis.  -Stool PCR is positive for enteropathogenic Escherichia coli. Status post 3 days of Rocephin -  improving, on oral vanc, appreciate GI and ID consult.  Acute Left peroneal vein DVT -Was started on Lovenox therapeutic dosing on 06/22/2018and switched to oral Xarelto on 05/13/2017. Anticoagulation  Held for a few days due to rectal bleeding - Resume Xarelto on 6/26,  hgb stable  Acute kidney injury -Cr 1.48 on admission -cr normalized, Resolved  Hypokalemia /Hypomagnesemia - Probably secondary to diarrhea -continue to replace, keep k >4, mag >2   Hyponatremia Na 129 on admission,  - Resolved;likely secondary to dehydration and diarrhea  Diabetes mellitus type 2 with hyperglycemia a1c 6.2 -Hold oral hypoglycemic agents;SSI  HTN -Hold diuretics, continue atenolol to avoid rebound tachycardia.  -blood pressure start to be elevated, adjust blood pressure meds,   Leukocytosis Probably secondary to C. difficile colitis. Improving. Repeat a.m. Labs  Thrombocytosis Probably reactive. Repeat a.m. Labs  Metabolic encephalopathy with baseline mild memory impairment Daughter report patient is getting more confused She is pleasantly confused, oriented to person only,  Frequent reorientation  DVT prophylaxis:Xarelto resumed on 6/26 Code Status:Full code Family Communication:Discussed with daughter present at bedside Disposition Plan:Home once diarrhea improved, with gi and id clearance  Consultants: GI Infectious disease  Procedures: Bilateral lower extremity  duplex ultrasound on 05/15/2017: Summary: There is  evidence of acute deep vein thrombosis involving the peroneal veins of the left lower extremity.  There is no evidence of superficial vein thrombosis involving the left lower extremity. There is no evidence of deep or superficial vein thrombosis involving the right lower extremity. There is no evidence of a Baker&'s cyst bilaterally.  Colonoscopy: On 05/18/2017; Preparation of the colon was fair. - Altered vascular, congested, inflamed and pseudopolypoid mucosa in the rectum, in the recto-sigmoid colon, in the sigmoid colon, in the descending colon and in the distal transverse colon. Biopsied.  Antimicrobials: Oral vancomycin from 05/13/2017 Ciprofloxacin and Flagyl on 05/13/2017 and discontinued on the same day; IV Flagyl restarted on 05/18/2017 Zithromax from 05/15/2017-05/16/2017 Rocephin from 05/16/2017-05/18/2017  Subjective:  She report feeling better, diarrhea seems has slowed down, good appetite, no n/v, no fever.  She is pleasant, less confused,  Daughter at bedside   Objective: Vitals:   05/20/17 1006 05/20/17 1455 05/20/17 2058 05/21/17 0632  BP:  (!) 138/46 (!) 135/43 (!) 156/65  Pulse:  65 65 73  Resp:  20 18 18   Temp:  97.9 F (36.6 C) 98.9 F (37.2 C) 97.5 F (36.4 C)  TempSrc:  Oral Oral Oral  SpO2: 99% 97% 98% 98%  Weight:      Height:        Intake/Output Summary (Last 24 hours) at 05/21/17 1608 Last data filed at 05/21/17 0900  Gross per 24 hour  Intake              240 ml  Output                0 ml  Net              240 ml   Filed Weights   05/13/17 1600 05/18/17 1008  Weight: 90.7 kg (200 lb) 90.7 kg (200 lb)    Examination:  General exam: Appears calm and comfortable , pleasant, less  confused, aaox3 today Respiratory system: Bilateral decreased breath sound at bases with scattered crackles. Cardiovascular system: S1 & S2 heard, rate controlled Gastrointestinal system: Abdomen is nondistended, soft and mildly tender in the epigastric  and periumbilical regions. Normal bowel sounds heard. Central nervous system: Alert and oriented. No focal neurological deficits. Moving extremities Extremities: No cyanosis, clubbing, trace pitting edema bilateral lower extremity  Skin: No rashes, lesions or ulcers Lymph: No cervical lymphadenopathy    Data Reviewed: I have personally reviewed following labs and imaging studies  CBC:  Recent Labs Lab 05/17/17 0658 05/18/17 0741 05/18/17 2050 05/19/17 0754 05/20/17 0514 05/21/17 1014  WBC 13.2* 15.5*  --  14.5* 11.9* 10.5  NEUTROABS  --   --   --   --  5.3  --   HGB 10.5* 11.1* 11.1* 10.4* 10.2* 12.6  HCT 31.6* 32.7* 33.4* 31.8* 31.0* 37.3  MCV 65.0* 65.1*  --  65.7* 66.0* 66.0*  PLT 610* 603*  --  615* 558* 681*   Basic Metabolic Panel:  Recent Labs Lab 05/17/17 0658 05/18/17 0741 05/19/17 0754 05/20/17 0514 05/21/17 1014  NA 138 138 140 138 139  K 3.9 4.4 3.6 3.3* 3.8  CL 108 105 104 104 101  CO2 23 24 27 26 29   GLUCOSE 112* 127* 117* 116* 143*  BUN 9 <5* <5* <5* <5*  CREATININE 0.63 0.58 0.63 0.63 0.78  CALCIUM 8.1* 8.0* 8.0* 8.0* 8.6*  MG 1.4* 1.3* 1.4* 1.6* 1.7   GFR: Estimated Creatinine Clearance:  65.3 mL/min (by C-G formula based on SCr of 0.78 mg/dL). Liver Function Tests: No results for input(s): AST, ALT, ALKPHOS, BILITOT, PROT, ALBUMIN in the last 168 hours. No results for input(s): LIPASE, AMYLASE in the last 168 hours. No results for input(s): AMMONIA in the last 168 hours. Coagulation Profile: No results for input(s): INR, PROTIME in the last 168 hours. Cardiac Enzymes:  Recent Labs Lab 05/17/17 0958  TROPONINI <0.03   BNP (last 3 results) No results for input(s): PROBNP in the last 8760 hours. HbA1C: No results for input(s): HGBA1C in the last 72 hours. CBG:  Recent Labs Lab 05/20/17 0710 05/20/17 1134 05/20/17 1700 05/21/17 0725 05/21/17 1159  GLUCAP 133* 137* 138* 134* 122*   Lipid Profile: No results for input(s): CHOL,  HDL, LDLCALC, TRIG, CHOLHDL, LDLDIRECT in the last 72 hours. Thyroid Function Tests: No results for input(s): TSH, T4TOTAL, FREET4, T3FREE, THYROIDAB in the last 72 hours. Anemia Panel: No results for input(s): VITAMINB12, FOLATE, FERRITIN, TIBC, IRON, RETICCTPCT in the last 72 hours. Sepsis Labs: No results for input(s): PROCALCITON, LATICACIDVEN in the last 168 hours.  Recent Results (from the past 240 hour(s))  C difficile quick scan w PCR reflex     Status: Abnormal   Collection Time: 05/13/17  3:23 PM  Result Value Ref Range Status   C Diff antigen POSITIVE (A) NEGATIVE Final   C Diff toxin POSITIVE (A) NEGATIVE Final   C Diff interpretation Toxin producing C. difficile detected.  Final    Comment: CRITICAL RESULT CALLED TO, READ BACK BY AND VERIFIED WITH: D.BLOCK RN AT 1828 ON 05/13/17 BY S.VANHOORNE   Gastrointestinal Panel by PCR , Stool     Status: Abnormal   Collection Time: 05/13/17  3:23 PM  Result Value Ref Range Status   Campylobacter species NOT DETECTED NOT DETECTED Final   Plesimonas shigelloides NOT DETECTED NOT DETECTED Final   Salmonella species NOT DETECTED NOT DETECTED Final   Yersinia enterocolitica NOT DETECTED NOT DETECTED Final   Vibrio species NOT DETECTED NOT DETECTED Final   Vibrio cholerae NOT DETECTED NOT DETECTED Final   Enteroaggregative E coli (EAEC) NOT DETECTED NOT DETECTED Final   Enteropathogenic E coli (EPEC) DETECTED (A) NOT DETECTED Final    Comment: RESULT CALLED TO, READ BACK BY AND VERIFIED WITH: DAVID BLOCK AT 1409 ON 05/14/2017 JJB    Enterotoxigenic E coli (ETEC) NOT DETECTED NOT DETECTED Final   Shiga like toxin producing E coli (STEC) NOT DETECTED NOT DETECTED Final   Shigella/Enteroinvasive E coli (EIEC) NOT DETECTED NOT DETECTED Final   Cryptosporidium NOT DETECTED NOT DETECTED Final   Cyclospora cayetanensis NOT DETECTED NOT DETECTED Final   Entamoeba histolytica NOT DETECTED NOT DETECTED Final   Giardia lamblia NOT DETECTED NOT  DETECTED Final   Adenovirus F40/41 NOT DETECTED NOT DETECTED Final   Astrovirus NOT DETECTED NOT DETECTED Final   Norovirus GI/GII NOT DETECTED NOT DETECTED Final   Rotavirus A NOT DETECTED NOT DETECTED Final   Sapovirus (I, II, IV, and V) NOT DETECTED NOT DETECTED Final         Radiology Studies: No results found.      Scheduled Meds: . atenolol  25 mg Oral Daily  . chlorhexidine  15 mL Mouth Rinse BID  . escitalopram  10 mg Oral Daily  . insulin aspart  0-9 Units Subcutaneous TID WC  . LORazepam  1 mg Oral Once  . mouth rinse  15 mL Mouth Rinse q12n4p  . Rivaroxaban  15  mg Oral BID WC  . saccharomyces boulardii  250 mg Oral BID  . vancomycin  500 mg Oral Q6H   Continuous Infusions:    LOS: 8 days     Time spent:   Dalores Weger, MD PhD [facility] Pager [phone]  If 7PM-7AM, please contact night-coverage [email or web address] Password [facility] 05/21/2017, 4:08 PM

## 2017-05-22 LAB — BASIC METABOLIC PANEL
ANION GAP: 8 (ref 5–15)
BUN: 10 mg/dL (ref 6–20)
CALCIUM: 8.5 mg/dL — AB (ref 8.9–10.3)
CO2: 29 mmol/L (ref 22–32)
Chloride: 104 mmol/L (ref 101–111)
Creatinine, Ser: 0.76 mg/dL (ref 0.44–1.00)
GFR calc Af Amer: >60 mL/min (ref >60)
GFR calc non Af Amer: >60 mL/min (ref >60)
GLUCOSE: 149 mg/dL — AB (ref 65–99)
Potassium: 3.7 mmol/L (ref 3.5–5.1)
Sodium: 141 mmol/L (ref 135–145)

## 2017-05-22 LAB — CBC
HCT: 33.6 % — ABNORMAL LOW (ref 36.0–46.0)
Hemoglobin: 11 g/dL — ABNORMAL LOW (ref 12.0–15.0)
MCH: 21.7 pg — AB (ref 26.0–34.0)
MCHC: 32.7 g/dL (ref 30.0–36.0)
MCV: 66.1 fL — ABNORMAL LOW (ref 78.0–100.0)
PLATELETS: 570 10*3/uL — AB (ref 150–400)
RBC: 5.08 MIL/uL (ref 3.87–5.11)
RDW: 16.2 % — AB (ref 11.5–15.5)
WBC: 10.2 10*3/uL (ref 4.0–10.5)

## 2017-05-22 LAB — GLUCOSE, CAPILLARY
GLUCOSE-CAPILLARY: 143 mg/dL — AB (ref 65–99)
Glucose-Capillary: 169 mg/dL — ABNORMAL HIGH (ref 65–99)
Glucose-Capillary: 128 mg/dL — ABNORMAL HIGH (ref 65–99)

## 2017-05-22 MED ORDER — SACCHAROMYCES BOULARDII 250 MG PO CAPS: 250.0000 mg | ORAL_CAPSULE | Freq: Two times a day (BID) | ORAL | 0 refills | Status: AC

## 2017-05-22 MED ORDER — FERROUS SULFATE 325 (65 FE) MG PO TABS: 325.0000 mg | ORAL_TABLET | Freq: Every day | ORAL | 0 refills | Status: AC

## 2017-05-22 MED ORDER — RIVAROXABAN (XARELTO) VTE STARTER PACK (15 & 20 MG): ORAL_TABLET | ORAL | 0 refills | Status: AC

## 2017-05-22 MED ORDER — VANCOMYCIN HCL 125 MG PO CAPS: ORAL_CAPSULE | ORAL | 0 refills | Status: AC

## 2017-05-22 MED ORDER — LISINOPRIL 2.5 MG PO TABS: 2.5000 mg | ORAL_TABLET | Freq: Every day | ORAL | 0 refills | Status: AC

## 2017-05-22 MED ORDER — ACETAMINOPHEN ER 650 MG PO TBCR: 650.0000 mg | EXTENDED_RELEASE_TABLET | Freq: Three times a day (TID) | ORAL | 0 refills | Status: AC | PRN

## 2017-05-22 MED ORDER — VANCOMYCIN 50 MG/ML ORAL SOLUTION: 500.0000 mg | Freq: Four times a day (QID) | ORAL | 0 refills | Status: DC

## 2017-05-22 MED ORDER — CARVEDILOL 3.125 MG PO TABS: 3.1250 mg | ORAL_TABLET | Freq: Two times a day (BID) | ORAL | 0 refills | Status: AC

## 2017-05-22 MED ORDER — VANCOMYCIN HCL 250 MG PO CAPS: 500.0000 mg | ORAL_CAPSULE | Freq: Four times a day (QID) | ORAL | 0 refills | Status: AC

## 2017-05-22 NOTE — Progress Notes (Signed)
[facility] Progress Note  Subjective: Feeling much better. He showed ID consult  Objective: Vital signs in last 24 hours: Temp:  [98.6 F (37 C)-98.7 F (37.1 C)] 98.6 F (37 C) (06/29 0636) Pulse Rate:  [65-79] 79 (06/29 0636) Resp:  [18-19] 18 (06/28 2149) BP: (145-192)/(52-59) 145/52 (06/29 0636) SpO2:  [100 %] 100 % (06/29 0636) Weight change:    RU:[id number]:unchanged  Lab Results: Results for orders placed or performed during the hospital encounter of 05/13/17 (from the past 24 hour(s))  Basic metabolic panel     Status: Abnormal   Collection Time: 05/21/17 10:14 AM  Result Value Ref Range   Sodium 139 135 - 145 mmol/L   Potassium 3.8 3.5 - 5.1 mmol/L   Chloride 101 101 - 111 mmol/L   CO2 29 22 - 32 mmol/L   Glucose, Bld 143 (H) 65 - 99 mg/dL   BUN <5 (L) 6 - 20 mg/dL   Creatinine, Ser 9.140.78 0.44 - 1.00 mg/dL   Calcium 8.6 (L) 8.9 - 10.3 mg/dL   GFR calc non Af Amer >60 >60 mL/min   GFR calc Af Amer >60 >60 mL/min   Anion gap 9 5 - 15  CBC     Status: Abnormal   Collection Time: 05/21/17 10:14 AM  Result Value Ref Range   WBC 10.5 4.0 - 10.5 K/uL   RBC 5.65 (H) 3.87 - 5.11 MIL/uL   Hemoglobin 12.6 12.0 - 15.0 g/dL   HCT 78.237.3 95.636.0 - 21.346.0 %   MCV 66.0 (L) 78.0 - 100.0 fL   MCH 22.3 (L) 26.0 - 34.0 pg   MCHC 33.8 30.0 - 36.0 g/dL   RDW 08.616.4 (H) 57.811.5 - 46.915.5 %   Platelets 681 (H) 150 - 400 K/uL  Magnesium     Status: None   Collection Time: 05/21/17 10:14 AM  Result Value Ref Range   Magnesium 1.7 1.7 - 2.4 mg/dL  Glucose, capillary     Status: Abnormal   Collection Time: 05/21/17 11:59 AM  Result Value Ref Range   Glucose-Capillary 122 (H) 65 - 99 mg/dL  Glucose, capillary     Status: Abnormal   Collection Time: 05/21/17  4:42 PM  Result Value Ref Range   Glucose-Capillary 122 (H) 65 - 99 mg/dL  Glucose, capillary     Status: Abnormal   Collection Time: 05/21/17  9:47 PM  Result Value Ref Range   Glucose-Capillary 200 (H) 65 - 99 mg/dL  Basic  metabolic panel     Status: Abnormal   Collection Time: 05/22/17  6:23 AM  Result Value Ref Range   Sodium 141 135 - 145 mmol/L   Potassium 3.7 3.5 - 5.1 mmol/L   Chloride 104 101 - 111 mmol/L   CO2 29 22 - 32 mmol/L   Glucose, Bld 149 (H) 65 - 99 mg/dL   BUN 10 6 - 20 mg/dL   Creatinine, Ser 6.290.76 0.44 - 1.00 mg/dL   Calcium 8.5 (L) 8.9 - 10.3 mg/dL   GFR calc non Af Amer >60 >60 mL/min   GFR calc Af Amer >60 >60 mL/min   Anion gap 8 5 - 15  CBC     Status: Abnormal   Collection Time: 05/22/17  6:23 AM  Result Value Ref Range   WBC 10.2 4.0 - 10.5 K/uL   RBC 5.08 3.87 - 5.11 MIL/uL   Hemoglobin 11.0 (L) 12.0 - 15.0 g/dL   HCT 52.833.6 (L) 41.336.0 - 24.446.0 %   MCV  66.1 (L) 78.0 - 100.0 fL   MCH 21.7 (L) 26.0 - 34.0 pg   MCHC 32.7 30.0 - 36.0 g/dL   RDW 40.9 (H) 81.1 - 91.4 %   Platelets 570 (H) 150 - 400 K/uL  Glucose, capillary     Status: Abnormal   Collection Time: 05/22/17  7:37 AM  Result Value Ref Range   Glucose-Capillary 143 (H) 65 - 99 mg/dL  Glucose, capillary     Status: Abnormal   Collection Time: 05/22/17  9:44 AM  Result Value Ref Range   Glucose-Capillary 169 (H) 65 - 99 mg/dL    Studies/Results: No results found.    Assessment: C. Difficile colitis, improving  Plan: Okay to discharge on antibiotic taper as outlined by Dr. Orvan Falconer. [facility] will sign off.    Martina Brodbeck C 05/22/2017, 10:05 AM  Pager [phone] If no answer or after 5 PM call [phone]

## 2017-05-22 NOTE — Progress Notes (Signed)
Patient ready for DC. DC instructions reviewed with patient and daughter. IV was removed yesterday. Daughter expresses understanding of DC instructions. Paper prescriptions provided in DC envelope. Patient escorted to main entrance via wheelchair.

## 2017-05-22 NOTE — Discharge Summary (Signed)
Discharge Summary  [name] ZOX:[id number] DOB: May 21, 1941  PCP: Sherrell Puller R  Admit date: 05/13/2017 Discharge date: 05/22/2017  Time spent: >41mins, more than 50% time spent on coordination of care and patient/family education  Recommendations for Outpatient Follow-up:  1. F/u with PMD within a week  for hospital discharge follow up, repeat cbc/bmp at follow up 2. pmd to continue adjust blood pressure meds 3. pmd to consider hematology referral if patient has persistent thrombocytosis.  Discharge Diagnoses:  Active Hospital Problems   Diagnosis Date Noted  . C. difficile colitis 05/13/2017  . Enteritis, enteropathogenic E. coli 05/20/2017  . GERD (gastroesophageal reflux disease) 05/20/2017  . Microcytic anemia 05/20/2017  . HTN (hypertension) 05/20/2017  . DVT (deep vein thrombosis) in pregnancy (HCC) 05/20/2017  . Rectal bleeding   . Hyponatremia 05/13/2017  . Hypokalemia 05/13/2017  . Type 2 diabetes mellitus without complication (HCC) 05/13/2017  . AKI (acute kidney injury) (HCC) 05/13/2017  . Dehydration 05/13/2017    Resolved Hospital Problems   Diagnosis Date Noted Date Resolved  No resolved problems to display.    Discharge Condition: stable  Diet recommendation: heart healthy/carb modified  Filed Weights   05/13/17 1600 05/18/17 1008  Weight: 90.7 kg (200 lb) 90.7 kg (200 lb)    History of present illness:  PCP: Sherrell Puller R  Patient coming from: Home  Chief Complaint: Diarrhea and generalized weakness  HPI: Shirley Collins is a 76 y.o. female with medical history significant of HTN and diabetes mellitus type 2 given to the hospital because of diarrhea and generalized weakness. Patient is from [place], she went to her primary care physician 5 days prior to admission for nausea, vomiting, abdominal pain and diarrhea. She was told she probably has Norovirus, and let it take its course. Her daughter who lives in [place] brought her here  because she was getting weaker and weaker. Reported her nausea and vomiting resolved but the diarrhea got worse to the point she became incontinent this morning, became extremely weak so she came to the hospital for further evaluation. In the ED initial evaluation showed potassium of 2.4 and CT scan of abdomen/pelvis showed diffuse colitis.  ED Course:  Vitals: WNL Labs: Potassium 2.4, WBC is 20.3, creatinine 1.4 Imaging: CT of abdomen/pelvis showed diffuse colitis Interventions: Given Cipro/Flagyl   Hospital Course:  Principal Problem:   C. difficile colitis Active Problems:   Hyponatremia   Hypokalemia   Type 2 diabetes mellitus without complication (HCC)   AKI (acute kidney injury) (HCC)   Dehydration   Rectal bleeding   Enteritis, enteropathogenic E. coli   GERD (gastroesophageal reflux disease)   Microcytic anemia   HTN (hypertension)   DVT (deep vein thrombosis) in pregnancy (HCC)   Rectal bleeding -Probably from a combination of severe colitis and anticoagulation and antiplatelet use. Aspirin/plavix  and Xarelto held since admission, xatelto resumed on 6/26  -daughter report seeing more blood after xarelto resumption, close monitor hgb, hgb10.4 on 6/26, 10.2 o n6/27..  - Status post flex sigmoidoscope on 6/25,  Per GI Dr Madilyn Fireman: "Extended sigmoidoscopy to about the distal transverse colon shows typical to almost classic pseudomembranes with some severe friability and erythema in the distal rectum. This is clearly C. difficile colitis, cannot rule out a superimposed IBD proctitis"   -gi oked to resume xarelto on 6/26 --hgb stable, tolerating diet -she is cleared to discharge home by gi  acute C. difficile Colitis/severe - Increase oral vancomycin to 500 mg by mouth every 6 hours  along with IV Flagyl as recommended by GI. Probiotics, This is her first episode of C. difficile colitis.  -Stool PCR is positive for enteropathogenic Escherichia coli. Status post 3 days of  Rocephin -  improving on oral vanc, diarrhea slowed down, leukocytosis normalized , appreciate GI and ID consult. -she is cleared to discharge home on prolonged taper of oral vanc, avoid ppi while taking oral vanc.  Acute Left peroneal vein DVT -Was started on Lovenox therapeutic dosing on 06/22/2018and switched to oral Xarelto on 05/13/2017. Anticoagulation  Held for a few days due to rectal bleeding - Resume Xarelto on 6/26,  hgb stable -she is discharged on xarelto, pmd follow up  Thrombocytosis Probably reactive. pmd to repeat lab at follow up, consider hematology referral if platelet remain elevated, patient and family made aware.   Acute kidney injury -Cr 1.48 on admission -cr normalized, Resolved  Hypokalemia /Hypomagnesemia - Probably secondary to diarrhea -continue to replace, keep k >4, mag >2   Hyponatremia Na 129 on admission,  - Resolved;likely secondary to dehydration and diarrhea  Diabetes mellitus type 2 with hyperglycemia a1c 6.2,  she received SSI in the hospital -oral hypoglycemic agents held in the hospital, resumed at discharge;  HTN -Hold diuretics, continue atenolol to avoid rebound tachycardia.  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  Metabolic encephalopathy with baseline mild memory impairment She is pleasantly confused, oriented to person only during hospitalization, this has resolved at time of discharge.   DVT prophylaxis:Xarelto resumed on 6/26 Code Status:Full code Family Communication:Discussed with daughter  present at bedside Disposition Plan:Home  with gi and id clearance  Consultants: GI Infectious disease  Procedures: Bilateral lower extremity duplex ultrasound on 05/15/2017: Summary: There is evidence of acute deep vein thrombosis involving the peroneal veins of the left lower extremity.  There is no evidence of superficial vein thrombosis involving the left lower extremity. There is no evidence of deep or superficial vein thrombosis involving the right lower extremity. There is no evidence of a Baker&'s cyst bilaterally.  Colonoscopy: On 05/18/2017; Preparation of the colon was fair. - Altered vascular, congested, inflamed and pseudopolypoid mucosa in the rectum, in the recto-sigmoid colon, in the sigmoid colon, in the descending colon and in the distal transverse colon. Biopsied.  Antimicrobials: Oral vancomycin from 05/13/2017 Ciprofloxacin and Flagyl on 05/13/2017 and discontinued on the same day; IV Flagyl restarted on 05/18/2017 Zithromax from 05/15/2017-05/16/2017 Rocephin from 05/16/2017-05/18/2017   Discharge Exam: BP (!) 145/52   Pulse 79   Temp 98.6 F (37 C) (Oral)   Resp 18   Ht 5\' 4"  (1.626 m)   Wt 90.7 kg (200 lb)   SpO2 100%   BMI 34.33 kg/m   General: NAD Cardiovascular: RRR Respiratory: crackles previous heard has resolved, now CTABL Ab: soft, NT/ND, + BS Extremity: previously documented mild pitting edema has resolved.  Discharge Instructions You were cared for by a hospitalist during your hospital stay. If you have any questions about your discharge medications or the care you received while you were in the hospital after you are discharged, you can call the  unit and asked to speak with the hospitalist on call if the hospitalist that took care of you is not available. Once you are discharged, your primary care physician will handle any further medical issues. Please note that NO REFILLS for any discharge medications will be authorized once  you are discharged, as it is imperative that you return to your primary care physician (or establish a relationship with a primary care physician if you do not have one) for your aftercare needs so that they can reassess your need for medications and monitor your lab values.  Discharge Instructions    Diet - low sodium heart healthy    Complete by:  As directed    Carb modified   Increase activity slowly    Complete by:  As directed      Allergies as of 05/22/2017   No Known Allergies     Medication List    STOP taking these medications   amLODipine 10 MG tablet Commonly known as:  NORVASC   atenolol 25 MG tablet Commonly known as:  TENORMIN   clopidogrel 75 MG tablet Commonly known as:  PLAVIX   DULoxetine 30 MG capsule Commonly known as:  CYMBALTA   DULoxetine 60 MG capsule Commonly known as:  CYMBALTA   pantoprazole 40 MG tablet Commonly known as:  PROTONIX     TAKE these medications   acetaminophen 650 MG CR tablet Commonly known as:  TYLENOL Take 1 tablet (650 mg total) by mouth every 8 (eight) hours as needed for pain. What changed:  how much to take   aspirin EC 81 MG tablet Take 81 mg by mouth daily.   carvedilol 3.125 MG tablet Commonly known as:  COREG Take 1 tablet (3.125 mg total) by mouth 2 (two) times daily with a meal.   D-3-5 5000 units capsule Generic drug:  Cholecalciferol Take 5,000 Units by mouth daily.   escitalopram 10 MG tablet Commonly known as:  LEXAPRO Take 10 mg by mouth daily.   ferrous sulfate 325 (65 FE) MG tablet Take 1 tablet (325 mg total) by mouth daily with breakfast. What changed:  when to take this   JARDIANCE 10 MG Tabs tablet Generic drug:  empagliflozin Take 10 mg by mouth daily.   lisinopril 2.5 MG tablet Commonly known as:  PRINIVIL,ZESTRIL Take 1 tablet (2.5 mg total) by mouth daily. Start taking on:  05/23/2017   pioglitazone 30 MG tablet Commonly known as:  ACTOS Take 30 mg by mouth daily.     Rivaroxaban 15 & 20 MG Tbpk Take as directed on package: Start with one 15mg  tablet by mouth twice a day with food. On Day 22, switch to one 20mg  tablet once a day with food.   saccharomyces boulardii 250 MG capsule Commonly known as:  FLORASTOR Take 1 capsule (250 mg total) by mouth 2 (two) times daily.   triamterene-hydrochlorothiazide 37.5-25 MG tablet Commonly known as:  MAXZIDE-25 Take 1 tablet by mouth daily.   vancomycin 250 MG capsule Commonly known as:  VANCOCIN HCL Take 2 capsules (500 mg total) by mouth 4 (four) times daily.   vancomycin 125 MG capsule Commonly known as:  VANCOCIN HCL Please start taking the following after your finish the 500mg  oral vancomycin:             Oral vancomycin 125 mg twice daily for 7 days, followed by             Oral vancomycin 125 mg once daily for 7  days, followed by             Oral vancomycin 125 mg every other day for 7 days, followed by             Oral vancomycin 125 mg every 3 days for 14 days Start taking on:  05/28/2017   VENTOLIN HFA 108 (90 Base) MCG/ACT inhaler Generic drug:  albuterol Inhale 1 puff into the lungs every 6 (six) hours as needed for wheezing or shortness of breath.      No Known Allergies Follow-up Information    Sherrell Puller R Follow up in 1 week(s).   Specialty:  Internal Medicine Why:  hospital discharge follow up, repeat cbc/bmp at follow up consider refer to hematology if platelet number remains elevate please check your blood pressure at home, bring in record for your doctor to reveiw and continue adjust blood pressure meds if needed. Contact information: [address] [place] [address] [phone]            The results of significant diagnostics from this hospitalization (including imaging, microbiology, ancillary and laboratory) are listed below for reference.    Significant Diagnostic Studies: Ct Abdomen Pelvis Wo Contrast  Result Date: 05/13/2017 CLINICAL DATA:   Abdominal pain and diarrhea EXAM: CT ABDOMEN AND PELVIS WITHOUT CONTRAST TECHNIQUE: Multidetector CT imaging of the abdomen and pelvis was performed following the standard protocol without IV contrast. COMPARISON:  None. FINDINGS: Lower chest: Lung bases are free of acute infiltrate or sizable effusion. Tiny 2 mm nodule is noted along the major fissure on the right best seen on image number 6 of series 3. Hepatobiliary: No focal liver abnormality is seen. No gallstones, gallbladder wall thickening, or biliary dilatation. Pancreas: Unremarkable. No pancreatic ductal dilatation or surrounding inflammatory changes. Spleen: Normal in size without focal abnormality. Adrenals/Urinary Tract: Adrenal glands are within normal limits. Left kidney demonstrates multiple hypodensities likely represent cysts. No obstructive changes are seen. The right kidney is considerably smaller than the left with a 5 mm calculus in the lower pole. No obstructive changes are seen. The bladder is decompressed. Stomach/Bowel: Scattered diverticular change of the colon is noted. Some wall thickening and pericolonic inflammatory changes are noted in the ascending colon and proximal transverse colon consistent with a focal colitis. The appendix is within normal limits. No obstructive changes are seen. Small sliding-type hiatal hernia is noted. Vascular/Lymphatic: Aortic atherosclerosis. No enlarged abdominal or pelvic lymph nodes. Reproductive: Uterus and bilateral adnexa are unremarkable. Other: No abdominal wall hernia or abnormality. No abdominopelvic ascites. Musculoskeletal: Degenerative change of the lumbar spine is seen. IMPRESSION: Tiny 2 mm nodule in the right lower lobe. No follow-up needed if patient is low-risk. Non-contrast chest CT can be considered in 12 months if patient is high-risk. This recommendation follows the consensus statement: Guidelines for Management of Incidental Pulmonary Nodules Detected on CT Images: From the  Fleischner Society 2017; Radiology 2017; 284:228-243. Left renal cysts and small right renal stone without obstruction. Diverticulosis without diverticulitis. Diffuse colitis involving the ascending colon and proximal transverse colon. No abscess or perforation is seen. Electronically Signed   By: Alcide Clever M.D.   On: 05/13/2017 11:42   Dg Chest 2 View  Result Date: 05/13/2017 CLINICAL DATA:  76 year old female with recent norovirus illness. New cough. Ongoing nausea vomiting diarrhea. EXAM: CHEST  2 VIEW COMPARISON:  CT Abdomen and Pelvis 1124 hours today. FINDINGS: Semi upright AP and lateral views of the chest. Low lung volumes. Mild cardiomegaly. Calcified  aortic atherosclerosis. No pneumothorax, pulmonary edema, pleural effusion or pneumoperitoneum. Mild left lung base atelectasis. No other abnormal pulmonary opacity. Flowing osteophytes in the spine. IMPRESSION: 1. Low lung volumes with mild atelectasis. No acute cardiopulmonary abnormality. 2.  Calcified aortic atherosclerosis. Electronically Signed   By: Odessa FlemingH  Hall M.D.   On: 05/13/2017 12:07    Microbiology: Recent Results (from the past 240 hour(s))  C difficile quick scan w PCR reflex     Status: Abnormal   Collection Time: 05/13/17  3:23 PM  Result Value Ref Range Status   C Diff antigen POSITIVE (A) NEGATIVE Final   C Diff toxin POSITIVE (A) NEGATIVE Final   C Diff interpretation Toxin producing C. difficile detected.  Final    Comment: CRITICAL RESULT CALLED TO, READ BACK BY AND VERIFIED WITH: D.BLOCK RN AT 1828 ON 05/13/17 BY S.VANHOORNE   Gastrointestinal Panel by PCR , Stool     Status: Abnormal   Collection Time: 05/13/17  3:23 PM  Result Value Ref Range Status   Campylobacter species NOT DETECTED NOT DETECTED Final   Plesimonas shigelloides NOT DETECTED NOT DETECTED Final   Salmonella species NOT DETECTED NOT DETECTED Final   Yersinia enterocolitica NOT DETECTED NOT DETECTED Final   Vibrio species NOT DETECTED NOT DETECTED  Final   Vibrio cholerae NOT DETECTED NOT DETECTED Final   Enteroaggregative E coli (EAEC) NOT DETECTED NOT DETECTED Final   Enteropathogenic E coli (EPEC) DETECTED (A) NOT DETECTED Final    Comment: RESULT CALLED TO, READ BACK BY AND VERIFIED WITH: DAVID BLOCK AT 1409 ON 05/14/2017 JJB    Enterotoxigenic E coli (ETEC) NOT DETECTED NOT DETECTED Final   Shiga like toxin producing E coli (STEC) NOT DETECTED NOT DETECTED Final   Shigella/Enteroinvasive E coli (EIEC) NOT DETECTED NOT DETECTED Final   Cryptosporidium NOT DETECTED NOT DETECTED Final   Cyclospora cayetanensis NOT DETECTED NOT DETECTED Final   Entamoeba histolytica NOT DETECTED NOT DETECTED Final   Giardia lamblia NOT DETECTED NOT DETECTED Final   Adenovirus F40/41 NOT DETECTED NOT DETECTED Final   Astrovirus NOT DETECTED NOT DETECTED Final   Norovirus GI/GII NOT DETECTED NOT DETECTED Final   Rotavirus A NOT DETECTED NOT DETECTED Final   Sapovirus (I, II, IV, and V) NOT DETECTED NOT DETECTED Final     Labs: Basic Metabolic Panel:  Recent Labs Lab 05/17/17 0658 05/18/17 0741 05/19/17 0754 05/20/17 0514 05/21/17 1014 05/22/17 0623  NA 138 138 140 138 139 141  K 3.9 4.4 3.6 3.3* 3.8 3.7  CL 108 105 104 104 101 104  CO2 23 24 27 26 29 29   GLUCOSE 112* 127* 117* 116* 143* 149*  BUN 9 <5* <5* <5* <5* 10  CREATININE 0.63 0.58 0.63 0.63 0.78 0.76  CALCIUM 8.1* 8.0* 8.0* 8.0* 8.6* 8.5*  MG 1.4* 1.3* 1.4* 1.6* 1.7  --    Liver Function Tests: No results for input(s): AST, ALT, ALKPHOS, BILITOT, PROT, ALBUMIN in the last 168 hours. No results for input(s): LIPASE, AMYLASE in the last 168 hours. No results for input(s): AMMONIA in the last 168 hours. CBC:  Recent Labs Lab 05/18/17 0741 05/18/17 2050 05/19/17 0754 05/20/17 0514 05/21/17 1014 05/22/17 0623  WBC 15.5*  --  14.5* 11.9* 10.5 10.2  NEUTROABS  --   --   --  5.3  --   --   HGB 11.1* 11.1* 10.4* 10.2* 12.6 11.0*  HCT 32.7* 33.4* 31.8* 31.0* 37.3 33.6*    MCV 65.1*  --  65.7* 66.0* 66.0* 66.1*  PLT 603*  --  615* 558* 681* 570*   Cardiac Enzymes:  Recent Labs Lab 05/17/17 0958  TROPONINI <0.03   BNP: BNP (last 3 results) No results for input(s): BNP in the last 8760 hours.  ProBNP (last 3 results) No results for input(s): PROBNP in the last 8760 hours.  CBG:  Recent Labs Lab 05/21/17 1159 05/21/17 1642 05/21/17 2147 05/22/17 0737 05/22/17 0944  GLUCAP 122* 122* 200* 143* 169*       Signed:  Kaitrin Seybold MD, PhD  [facility] 05/22/2017, 11:07 AM

## 2018-08-21 IMAGING — CR DG CHEST 2V
2 series · 2 of 2 positions shown · non-contrast
Comparison: CT Abdomen and Pelvis 9966 hours today.

CLINICAL DATA: 76-year-old female with recent norovirus illness.
New cough. Ongoing nausea vomiting diarrhea.

EXAM:
CHEST  2 VIEW

[x chest ap]
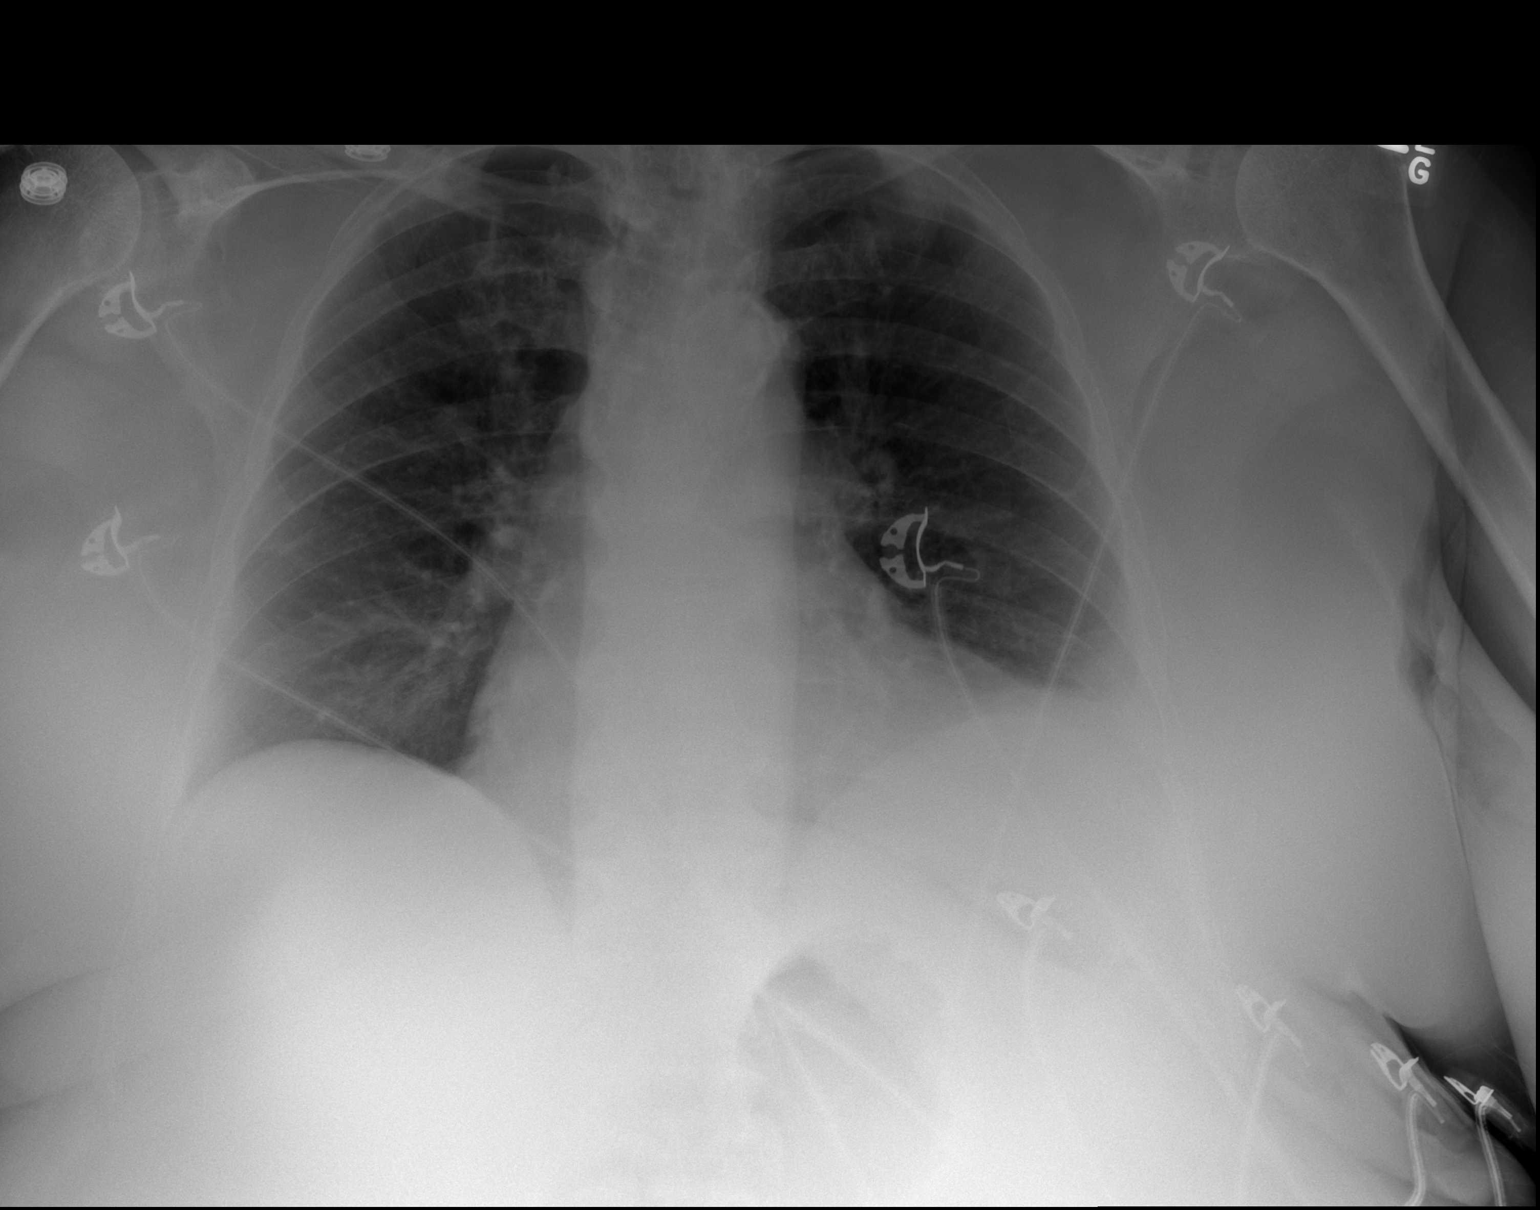

[w chest lat]
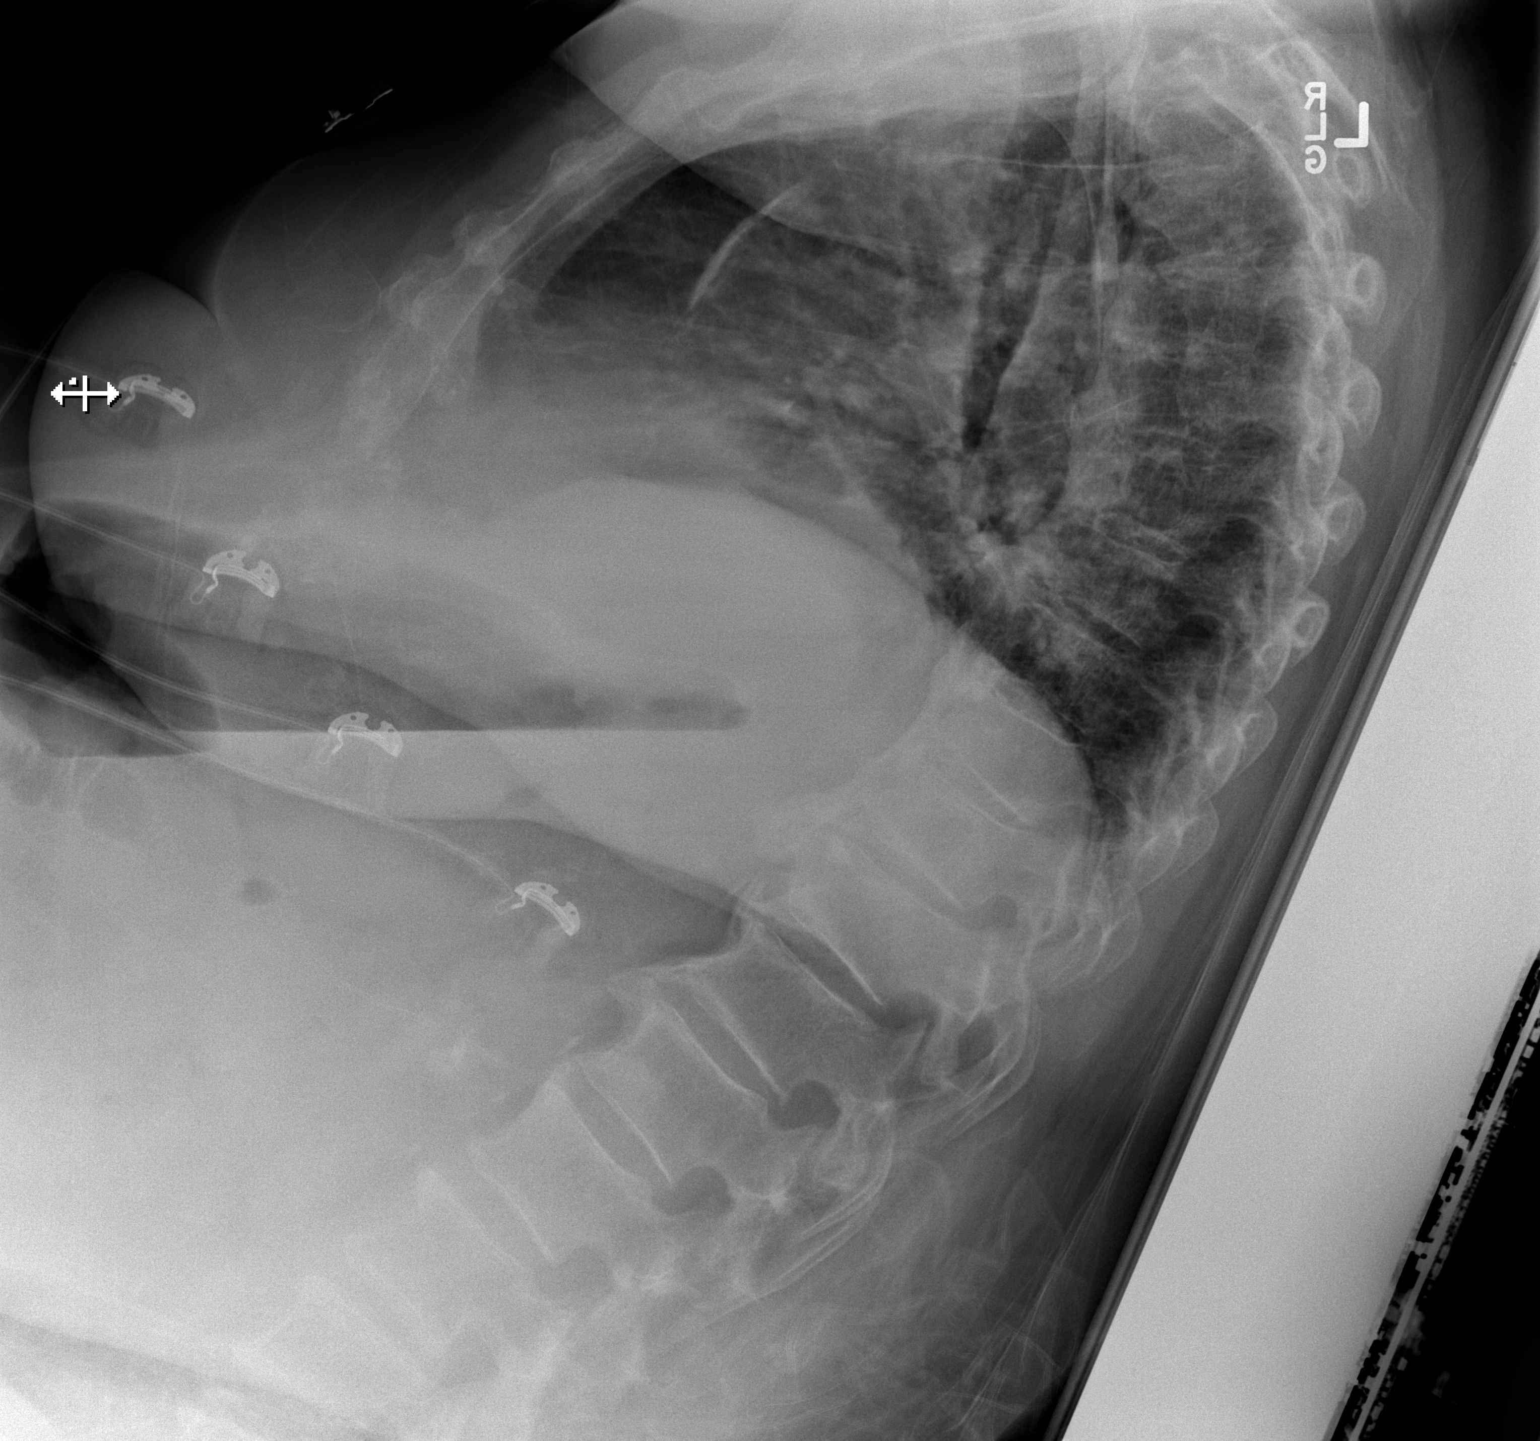

[2 of 2 positions shown; findings below may reference images not displayed]

FINDINGS: Semi upright AP and lateral views of the chest. Low lung volumes.
Mild cardiomegaly. Calcified aortic atherosclerosis.

No pneumothorax, pulmonary edema, pleural effusion or
pneumoperitoneum. Mild left lung base atelectasis. No other abnormal
pulmonary opacity.

Flowing osteophytes in the spine.
IMPRESSION: 1. Low lung volumes with mild atelectasis. No acute cardiopulmonary
abnormality.
2.  Calcified aortic atherosclerosis.

## 2018-08-21 IMAGING — CT CT ABD-PELV W/O CM
2 of 4 series · 16 of 46 positions shown, 18 images · non-contrast
Comparison: None.

CLINICAL DATA: Abdominal pain and diarrhea

EXAM:
CT ABDOMEN AND PELVIS WITHOUT CONTRAST
TECHNIQUE: Multidetector CT imaging of the abdomen and pelvis was performed
following the standard protocol without IV contrast.

[Series 2: abd/pel w/o · axial · non-contrast · 0.98mm/px · z∈[-431,-16]mm · 13 of 93 slices shown, 15 images]
[im 5/93  soft-tissue]
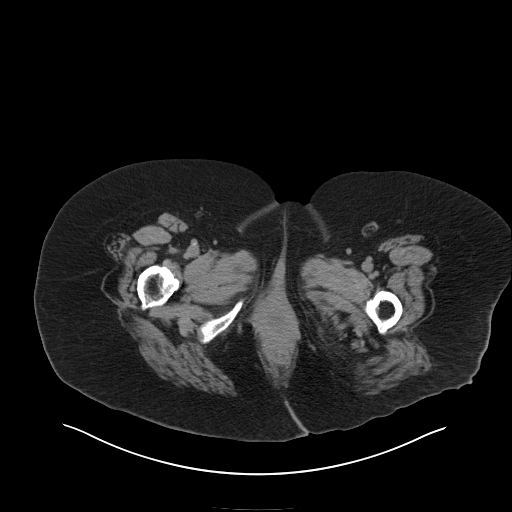
[im 5/93  bone]
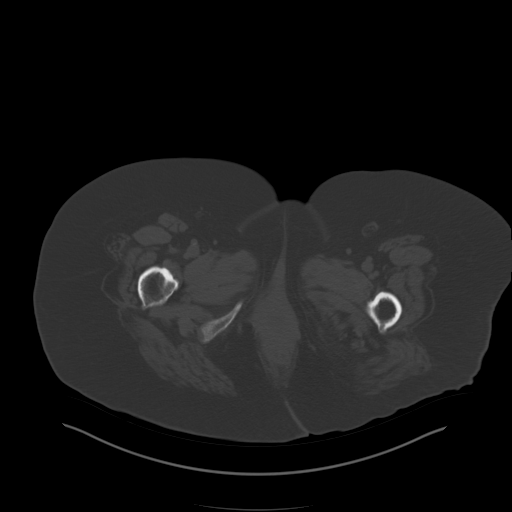
[im 15/93  soft-tissue]
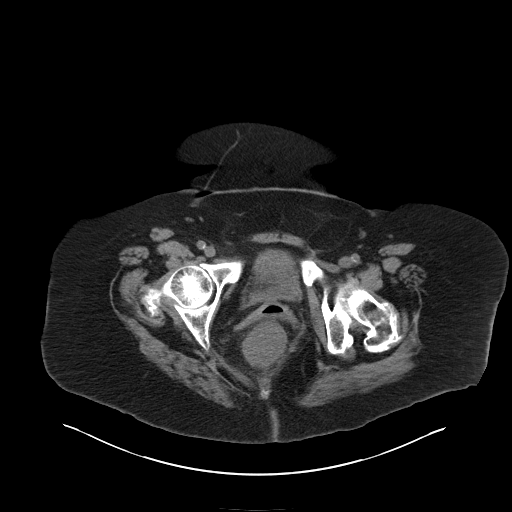
[im 20/93  soft-tissue]
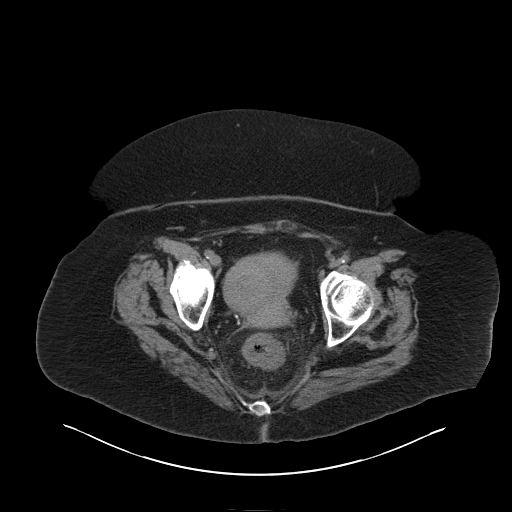
[im 25/93  soft-tissue]
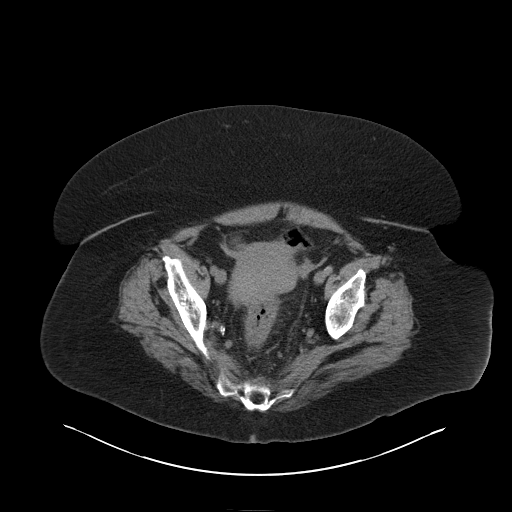
[im 34/93  soft-tissue]
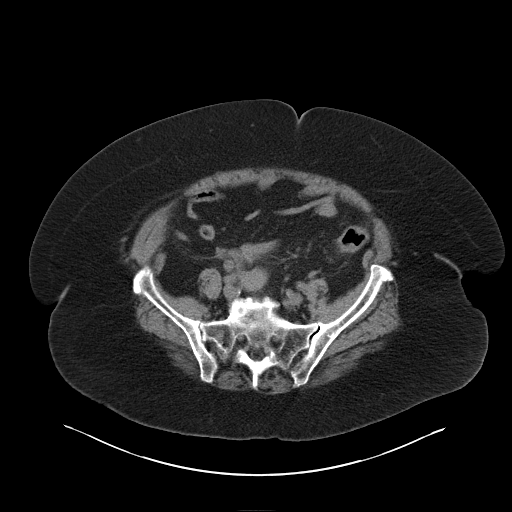
[im 39/93  soft-tissue]
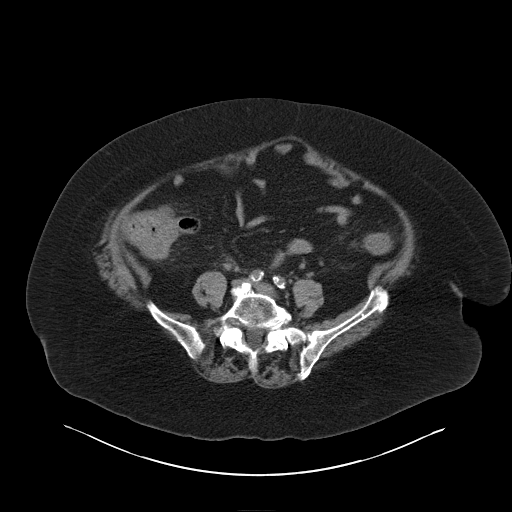
[im 49/93  soft-tissue]
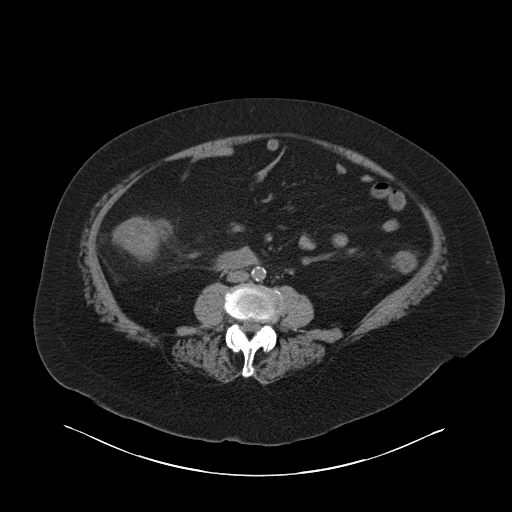
[im 54/93  soft-tissue]
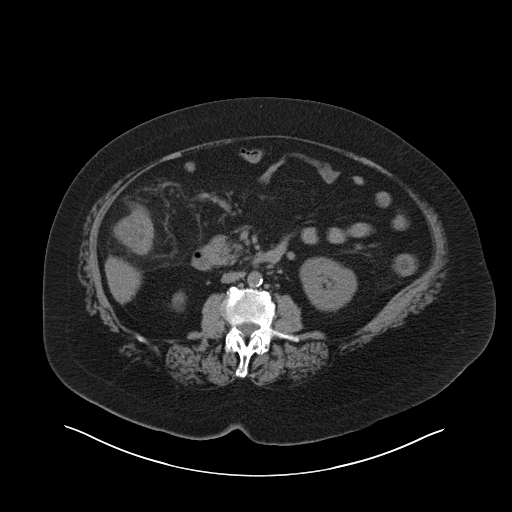
[im 59/93  soft-tissue]
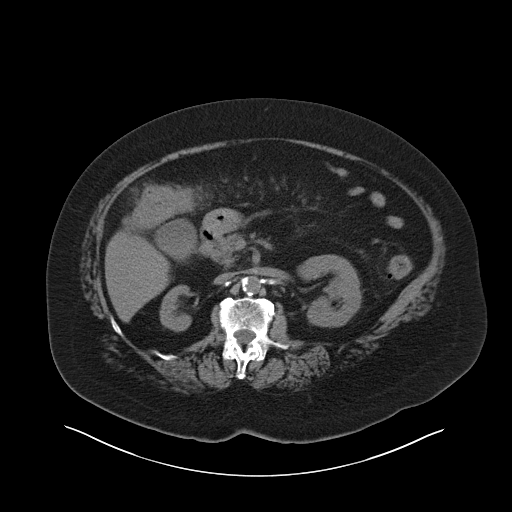
[im 59/93  bone]
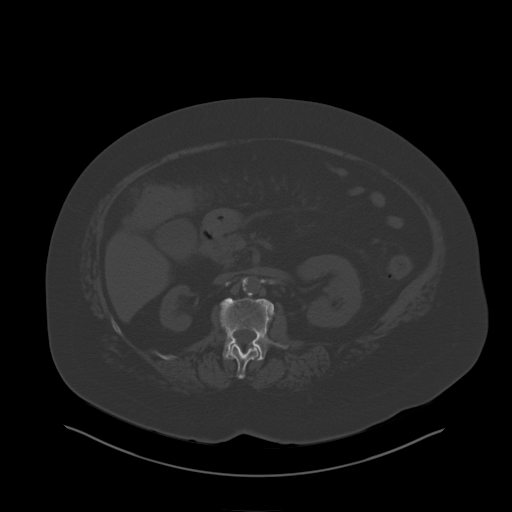
[im 68/93  soft-tissue]
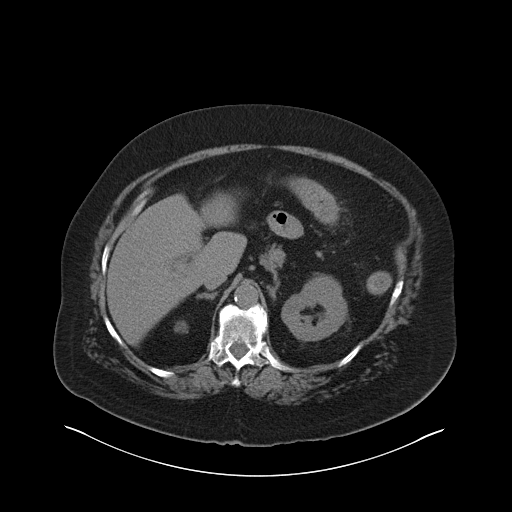
[im 73/93  soft-tissue]
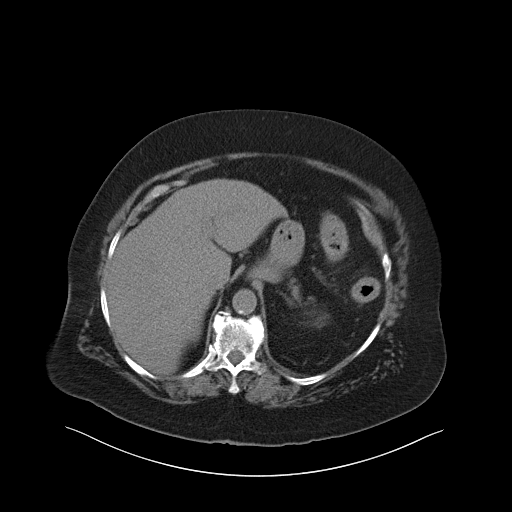
[im 78/93  soft-tissue]
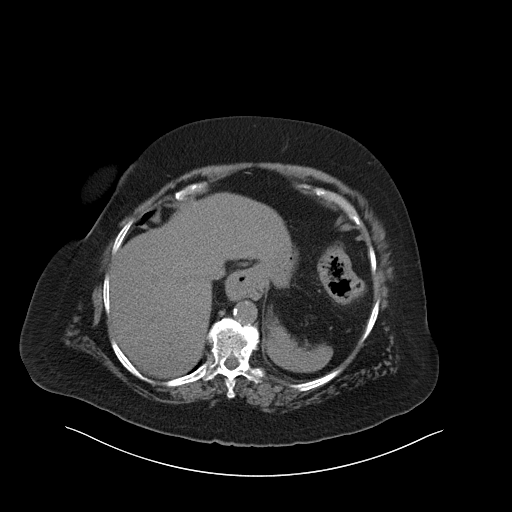
[im 88/93  soft-tissue]
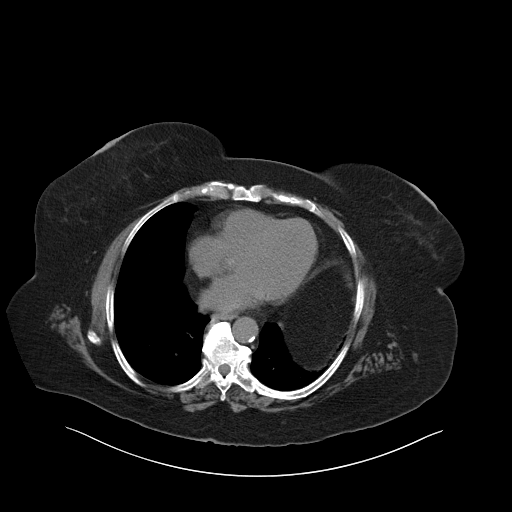

[Series 5: coronal · coronal · 0.82mm/px · 3 of 150 slices shown]
[im 50/150  soft-tissue]
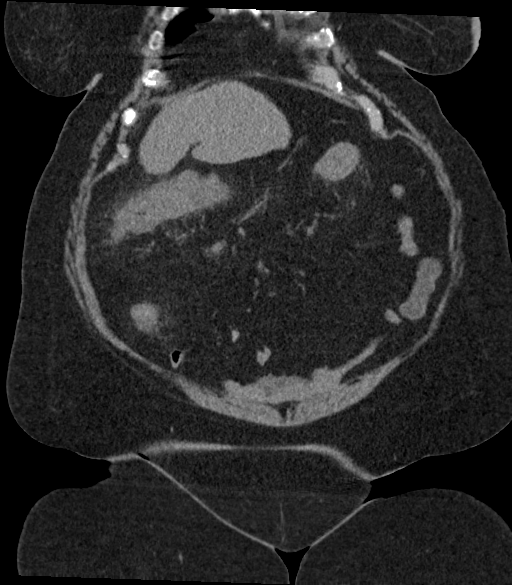
[im 67/150  soft-tissue]
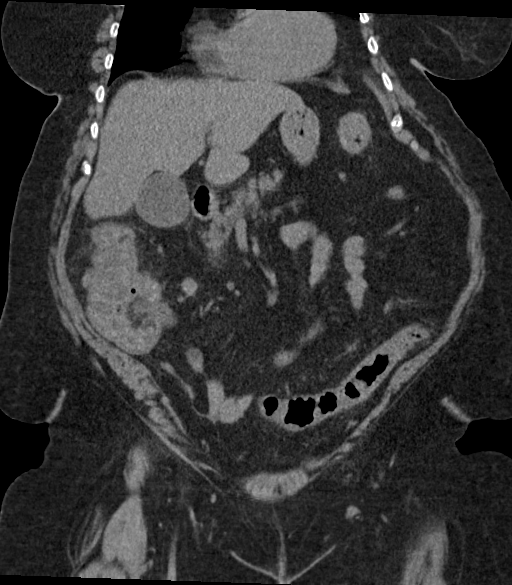
[im 83/150  soft-tissue]
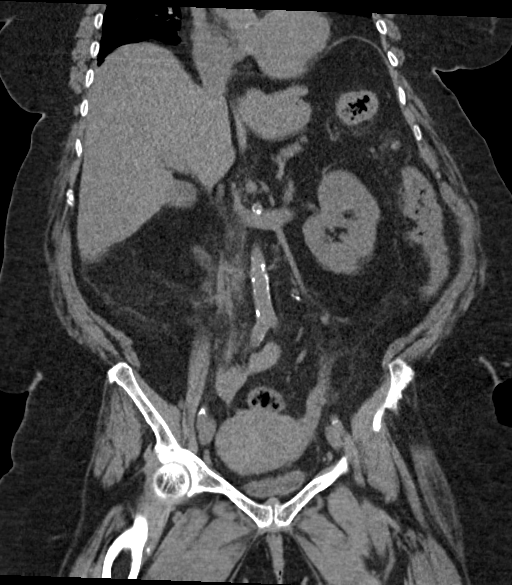

[16 of 46 positions shown; findings below may reference images not displayed]

FINDINGS: Lower chest: Lung bases are free of acute infiltrate or sizable
effusion. Tiny 2 mm nodule is noted along the major fissure on the
right best seen on image number 6 of series 3.

Hepatobiliary: No focal liver abnormality is seen. No gallstones,
gallbladder wall thickening, or biliary dilatation.

Pancreas: Unremarkable. No pancreatic ductal dilatation or
surrounding inflammatory changes.

Spleen: Normal in size without focal abnormality.

Adrenals/Urinary Tract: Adrenal glands are within normal limits.
Left kidney demonstrates multiple hypodensities likely represent
cysts. No obstructive changes are seen. The right kidney is
considerably smaller than the left with a 5 mm calculus in the lower
pole. No obstructive changes are seen. The bladder is decompressed.

Stomach/Bowel: Scattered diverticular change of the colon is noted.
Some wall thickening and pericolonic inflammatory changes are noted
in the ascending colon and proximal transverse colon consistent with
a focal colitis. The appendix is within normal limits. No
obstructive changes are seen. Small sliding-type hiatal hernia is
noted.

Vascular/Lymphatic: Aortic atherosclerosis. No enlarged abdominal or
pelvic lymph nodes.

Reproductive: Uterus and bilateral adnexa are unremarkable.

Other: No abdominal wall hernia or abnormality. No abdominopelvic
ascites.

Musculoskeletal: Degenerative change of the lumbar spine is seen.
IMPRESSION: Tiny 2 mm nodule in the right lower lobe. No follow-up needed if
patient is low-risk. Non-contrast chest CT can be considered in 12
months if patient is high-risk. This recommendation follows the
consensus statement: Guidelines for Management of Incidental
Pulmonary Nodules Detected on CT Images: From the [HOSPITAL]

Left renal cysts and small right renal stone without obstruction.

Diverticulosis without diverticulitis.

Diffuse colitis involving the ascending colon and proximal
transverse colon. No abscess or perforation is seen.

## 2019-09-28 ENCOUNTER — Other Ambulatory Visit: Payer: Self-pay

## 2019-09-28 ENCOUNTER — Encounter (HOSPITAL_COMMUNITY): Payer: Self-pay

## 2019-09-28 ENCOUNTER — Emergency Department (HOSPITAL_COMMUNITY)
Admission: EM | Admit: 2019-09-28 | Discharge: 2019-09-28 | Disposition: A | Discharge status: Home / Self Care | Payer: Medicare (Managed Care) | Attending: Emergency Medicine | Admitting: Emergency Medicine

## 2019-09-28 DIAGNOSIS — N3000 Acute cystitis without hematuria: Secondary | ICD-10-CM | POA: Insufficient documentation

## 2019-09-28 DIAGNOSIS — N189 Chronic kidney disease, unspecified: Secondary | ICD-10-CM | POA: Insufficient documentation

## 2019-09-28 DIAGNOSIS — Z7982 Long term (current) use of aspirin: Secondary | ICD-10-CM | POA: Diagnosis not present

## 2019-09-28 DIAGNOSIS — I130 Hypertensive heart and chronic kidney disease with heart failure and stage 1 through stage 4 chronic kidney disease, or unspecified chronic kidney disease: Secondary | ICD-10-CM | POA: Diagnosis not present

## 2019-09-28 DIAGNOSIS — E1122 Type 2 diabetes mellitus with diabetic chronic kidney disease: Secondary | ICD-10-CM | POA: Diagnosis not present

## 2019-09-28 DIAGNOSIS — Z86718 Personal history of other venous thrombosis and embolism: Secondary | ICD-10-CM | POA: Insufficient documentation

## 2019-09-28 DIAGNOSIS — Z87891 Personal history of nicotine dependence: Secondary | ICD-10-CM | POA: Insufficient documentation

## 2019-09-28 DIAGNOSIS — Z79899 Other long term (current) drug therapy: Secondary | ICD-10-CM | POA: Diagnosis not present

## 2019-09-28 DIAGNOSIS — N179 Acute kidney failure, unspecified: Secondary | ICD-10-CM | POA: Diagnosis not present

## 2019-09-28 DIAGNOSIS — I509 Heart failure, unspecified: Secondary | ICD-10-CM | POA: Insufficient documentation

## 2019-09-28 DIAGNOSIS — R55 Syncope and collapse: Secondary | ICD-10-CM

## 2019-09-28 LAB — URINALYSIS, ROUTINE W REFLEX MICROSCOPIC
Bilirubin Urine: NEGATIVE
Glucose, UA: >=500 mg/dL — AB
Ketones, ur: NEGATIVE mg/dL
Nitrite: NEGATIVE
Protein, ur: NEGATIVE mg/dL
Specific Gravity, Urine: 1.008 (ref 1.005–1.030)
pH: 5 (ref 5.0–8.0)

## 2019-09-28 LAB — CBC
HCT: 36.9 % (ref 36.0–46.0)
Hemoglobin: 11.3 g/dL — ABNORMAL LOW (ref 12.0–15.0)
MCH: 21.5 pg — ABNORMAL LOW (ref 26.0–34.0)
MCHC: 30.6 g/dL (ref 30.0–36.0)
MCV: 70.3 fL — ABNORMAL LOW (ref 80.0–100.0)
Platelets: 322 10*3/uL (ref 150–400)
RBC: 5.25 MIL/uL — ABNORMAL HIGH (ref 3.87–5.11)
RDW: 16.4 % — ABNORMAL HIGH (ref 11.5–15.5)
WBC: 10.8 10*3/uL — ABNORMAL HIGH (ref 4.0–10.5)
nRBC: 0 % (ref 0.0–0.2)

## 2019-09-28 LAB — BASIC METABOLIC PANEL
Anion gap: 12 (ref 5–15)
BUN: 27 mg/dL — ABNORMAL HIGH (ref 8–23)
CO2: 26 mmol/L (ref 22–32)
Calcium: 8.8 mg/dL — ABNORMAL LOW (ref 8.9–10.3)
Chloride: 100 mmol/L (ref 98–111)
Creatinine, Ser: 1.85 mg/dL — ABNORMAL HIGH (ref 0.44–1.00)
GFR calc Af Amer: 30 mL/min — ABNORMAL LOW (ref >60)
GFR calc non Af Amer: 26 mL/min — ABNORMAL LOW (ref >60)
Glucose, Bld: 108 mg/dL — ABNORMAL HIGH (ref 70–99)
Potassium: 3.9 mmol/L (ref 3.5–5.1)
Sodium: 138 mmol/L (ref 135–145)

## 2019-09-28 LAB — CBG MONITORING, ED: Glucose-Capillary: 95 mg/dL (ref 70–99)

## 2019-09-28 MED ORDER — SODIUM CHLORIDE 0.9 % IV SOLN
INTRAVENOUS | Status: DC
  Administered 2019-09-28: 17:00:00 via INTRAVENOUS

## 2019-09-28 MED ORDER — SODIUM CHLORIDE 0.9% FLUSH
3.0000 mL | Freq: Once | INTRAVENOUS | Status: AC
  Administered 2019-09-28: 17:00:00 3 mL via INTRAVENOUS

## 2019-09-28 MED ORDER — LEVOFLOXACIN 750 MG PO TABS: 750.0000 mg | ORAL_TABLET | Freq: Every day | ORAL | 0 refills | Status: AC

## 2019-09-28 MED ORDER — LEVOFLOXACIN 750 MG PO TABS
750.0000 mg | ORAL_TABLET | Freq: Once | ORAL | Status: AC
  Administered 2019-09-28: 21:00:00 750 mg via ORAL
  Filled 2019-09-28: qty 1

## 2019-09-28 MED ORDER — SODIUM CHLORIDE 0.9 % IV BOLUS
500.0000 mL | Freq: Once | INTRAVENOUS | Status: AC
  Administered 2019-09-28: 500 mL via INTRAVENOUS

## 2019-09-28 NOTE — Discharge Instructions (Signed)
Your Creatinine was 1.85 - share this with your doctor Drink lots of fluids You have a Urinary infection Take Levaquin once daily for 7 days You will need repeat blood work on Monday at the latest - call your doctor to arrange a follow up for this. ER for worsening symptoms.

## 2019-09-28 NOTE — ED Provider Notes (Signed)
[place] EMERGENCY DEPARTMENT Provider Note   CSN: [id number] Arrival date & time: 09/28/19  1622     History   Chief Complaint Chief Complaint  Patient presents with  . Loss of Consciousness    HPI Terrace Chiem is a 78 y.o. female.     HPI  This patient is a 78 year old female, she has a history of congestive heart failure as well as mild dementia, diabetes, acid reflux, hypertension and a prior stroke.  She was in her usual state of health except she did not have anything for lunch today as they were on a long car ride.  She was walking up the steps which she states are very long and very steep and by the time she got to the time she was feeling short of breath and lightheaded.  She had a near syncopal episode, family called paramedics who found the patient to be mildly bradycardic and diaphoretic.  This improved in route to the hospital as intermittent status and now she feels back to normal.  She has no chest pain shortness of breath headache blurred vision numbness weakness or any other complaints at this time.  She states she wants to eat something  Past Medical History:  Diagnosis Date  . Anemia   . Anxiety   . Arthritis   . Asthma   . CHF (congestive heart failure) ([place])   . Chronic kidney disease   . Coronary artery disease   . Dementia ([place])   . Depression   . Diabetes mellitus without complication ([place])   . GERD (gastroesophageal reflux disease)   . Hypertension   . Pneumonia   . Stroke [facility])     Patient Active Problem List   Diagnosis Date Noted  . Enteritis, enteropathogenic E. coli 05/20/2017  . GERD (gastroesophageal reflux disease) 05/20/2017  . Microcytic anemia 05/20/2017  . HTN (hypertension) 05/20/2017  . DVT (deep vein thrombosis) in pregnancy 05/20/2017  . Rectal bleeding   . C. difficile colitis 05/13/2017  . Hyponatremia 05/13/2017  . Hypokalemia 05/13/2017  . Type 2 diabetes mellitus without complication ([place])  43/32/9518  . AKI (acute kidney injury) ([place]) 05/13/2017  . Dehydration 05/13/2017    Past Surgical History:  Procedure Laterality Date  . CATARACT EXTRACTION, BILATERAL    . CORONARY ANGIOPLASTY    . EYE SURGERY    . FLEXIBLE SIGMOIDOSCOPY Left 05/18/2017   Procedure: FLEXIBLE SIGMOIDOSCOPY;  Surgeon: Teena Irani, MD;  Location: [facility] ENDOSCOPY;  Service: Endoscopy;  Laterality: Left;  . TUBAL LIGATION       OB History   No obstetric history on file.      Home Medications    Prior to Admission medications   Medication Sig Start Date End Date Taking? Authorizing Provider  acetaminophen (TYLENOL) 650 MG CR tablet Take 1 tablet (650 mg total) by mouth every 8 (eight) hours as needed for pain. 05/22/17   Florencia Reasons, MD  aspirin EC 81 MG tablet Take 81 mg by mouth daily.    [provider]  carvedilol (COREG) 3.125 MG tablet Take 1 tablet (3.125 mg total) by mouth 2 (two) times daily with a meal. 05/22/17   Florencia Reasons, MD  Cholecalciferol (D-3-5) 5000 units capsule Take 5,000 Units by mouth daily.    [provider]  escitalopram (LEXAPRO) 10 MG tablet Take 10 mg by mouth daily. 04/25/17   [provider]  ferrous sulfate 325 (65 FE) MG tablet Take 1 tablet (325 mg total) by  mouth daily with breakfast. 05/22/17   Albertine Grates, MD  JARDIANCE 10 MG TABS tablet Take 10 mg by mouth daily. 05/12/17   [provider]  levofloxacin (LEVAQUIN) 750 MG tablet Take 1 tablet (750 mg total) by mouth daily. 09/28/19   Eber Hong, MD  lisinopril (PRINIVIL,ZESTRIL) 2.5 MG tablet Take 1 tablet (2.5 mg total) by mouth daily. 05/23/17   Albertine Grates, MD  pioglitazone (ACTOS) 30 MG tablet Take 30 mg by mouth daily. 04/25/17   [provider]  Rivaroxaban 15 & 20 MG TBPK Take as directed on package: Start with one  tablet by mouth twice a day with food. On Day 22, switch to one  tablet once a day with food. 05/22/17   Albertine Grates, MD  saccharomyces boulardii (FLORASTOR) 250 MG  capsule Take 1 capsule (250 mg total) by mouth 2 (two) times daily. 05/22/17   Albertine Grates, MD  triamterene-hydrochlorothiazide (MAXZIDE-25) 37.5-25 MG tablet Take 1 tablet by mouth daily. 04/25/17   [provider]  vancomycin (VANCOCIN HCL) 125 MG capsule Please start taking the following after your finish the  oral vancomycin:             Oral vancomycin 125 mg twice daily for 7 days, followed by             Oral vancomycin 125 mg once daily for 7 days, followed by             Oral vancomycin 125 mg every other day for 7 days, followed by             Oral vancomycin 125 mg every 3 days for 14 days 05/28/17   Albertine Grates, MD  VENTOLIN HFA 108 (90 Base) MCG/ACT inhaler Inhale 1 puff into the lungs every 6 (six) hours as needed for wheezing or shortness of breath. 02/11/17   [provider]    Family History No family history on file.  Social History Social History   Tobacco Use  . Smoking status: Former Smoker    Packs/day: 1.00    Years: 20.00    Pack years: 20.00    Types: Cigarettes  . Smokeless tobacco: Never Used  Substance Use Topics  . Alcohol use: No  . Drug use: No     Allergies   Penicillins, Shellfish allergy, and Strawberry extract   Review of Systems Review of Systems  All other systems reviewed and are negative.    Physical Exam Updated Vital Signs BP (!) 159/65   Pulse 64   Temp 98.5 F (36.9 C) (Oral)   Resp 18   Ht 1.6 m ( )   Wt 94.3 kg   SpO2 98%   BMI 36.85 kg/m   Physical Exam Vitals signs and nursing note reviewed.  Constitutional:      General: She is not in acute distress.    Appearance: She is well-developed.  HENT:     Head: Normocephalic and atraumatic.     Mouth/Throat:     Pharynx: No oropharyngeal exudate.  Eyes:     General: No scleral icterus.       Right eye: No discharge.        Left eye: No discharge.     Conjunctiva/sclera: Conjunctivae normal.     Pupils: Pupils are equal, round, and reactive to  light.  Neck:     Musculoskeletal: Normal range of motion and neck supple.     Thyroid: No thyromegaly.     Vascular: No  JVD.  Cardiovascular:     Rate and Rhythm: Regular rhythm. Bradycardia present.     Heart sounds: Normal heart sounds. No murmur. No friction rub. No gallop.      Comments: Mild brady at 57 bpm. Pulmonary:     Effort: Pulmonary effort is normal. No respiratory distress.     Breath sounds: Normal breath sounds. No wheezing or rales.  Abdominal:     General: Bowel sounds are normal. There is no distension.     Palpations: Abdomen is soft. There is no mass.     Tenderness: There is no abdominal tenderness.  Musculoskeletal: Normal range of motion.        General: No tenderness.  Lymphadenopathy:     Cervical: No cervical adenopathy.  Skin:    General: Skin is warm and dry.     Findings: No erythema or rash.  Neurological:     Mental Status: She is alert.     Coordination: Coordination normal.     Comments: Neurologic exam:  Speech clear, pupils equal round reactive to light, extraocular movements intact  Normal peripheral visual fields Cranial nerves III through XII normal including no facial droop Follows commands, moves all extremities x4, normal strength to bilateral upper and lower extremities at all major muscle groups including grip Sensation normal to light touch and pinprick Coordination intact, no limb ataxia, finger-nose-finger   Psychiatric:        Behavior: Behavior normal.      ED Treatments / Results  Labs (all labs ordered are listed, but only abnormal results are displayed) Labs Reviewed  BASIC METABOLIC PANEL - Abnormal; Notable for the following components:      Result Value   Glucose, Bld 108 (*)    BUN 27 (*)    Creatinine, Ser 1.85 (*)    Calcium 8.8 (*)    GFR calc non Af Amer 26 (*)    GFR calc Af Amer 30 (*)    All other components within normal limits  CBC - Abnormal; Notable for the following components:   WBC 10.8 (*)     RBC 5.25 (*)    Hemoglobin 11.3 (*)    MCV 70.3 (*)    MCH 21.5 (*)    RDW 16.4 (*)    All other components within normal limits  URINALYSIS, ROUTINE W REFLEX MICROSCOPIC - Abnormal; Notable for the following components:   APPearance HAZY (*)    Glucose, UA >=500 (*)    Hgb urine dipstick SMALL (*)    Leukocytes,Ua MODERATE (*)    Bacteria, UA MANY (*)    All other components within normal limits  URINE CULTURE  CBG MONITORING, ED    EKG EKG Interpretation  Date/Time:  Wednesday September 28 2019 16:22:47 EST Ventricular Rate:  57 PR Interval:    QRS Duration: 105 QT Interval:  462 QTC Calculation: 450 R Axis:   -29 Text Interpretation: Sinus rhythm Prolonged PR interval Borderline left axis deviation Probable anterior infarct, age indeterminate Since last tracing rate slower Confirmed by Eber HongMiller, Jeffie Widdowson ([id number]) on 09/28/2019 5:08:58 PM   Radiology No results found.  Procedures Procedures (including critical care time)  Medications Ordered in ED Medications  0.9 %  sodium chloride infusion ( Intravenous Stopped 09/28/19 2029)  levofloxacin (LEVAQUIN) tablet 750 mg (has no administration in time range)  sodium chloride flush (NS) 0.9 % injection 3 mL (3 mLs Intravenous Given 09/28/19 1635)  sodium chloride 0.9 % bolus 500 mL (0 mLs Intravenous Stopped  09/28/19 2013)     Initial Impression / Assessment and Plan / ED Course  I have reviewed the triage vital signs and the nursing notes.  Pertinent labs & imaging results that were available during my care of the patient were reviewed by me and considered in my medical decision making (see chart for details).        Syncope Benign exam, vital signs normal, labs thus far are looking very normal as well.  She has a creatinine of 1.85 which is new from 0.76 a couple of years ago.  Her BUN is 27 suggesting some relative dehydration.  She will be given some IV fluids, recheck a metabolic panel however she appears well and I  doubt the need for admission to the hospital as long as she improves.  Patient agreeable  The patient has a nontender abdomen, no CVA tenderness, her urinalysis shows many bacteria with 21-50 white blood cells and negative nitrites.  I have cultured the urine however I suspect that dehydration and the presence of a UTI or in combination causing the mild renal insufficiency.  She has been given both oral and intravenous hydration as well as first dose of antibiotics.  Due to her penicillin allergy and the requirement for good renal function to use Macrobid we have had to use a fluoroquinolone.  The patient is agreeable, stable for discharge in the care of her family member, she does not want stay in the hospital.  I think this is reasonable and they know what to come back for, they will have renal function rechecked on Monday  Final Clinical Impressions(s) / ED Diagnoses   Final diagnoses:  Acute kidney injury [facility])  Near syncope  Acute cystitis without hematuria    ED Discharge Orders         Ordered    levofloxacin (LEVAQUIN) 750 MG tablet  Daily     09/28/19 2055           Eber Hong, MD 09/28/19 2057

## 2019-09-28 NOTE — ED Notes (Signed)
CBG 95 

## 2019-09-28 NOTE — ED Notes (Signed)
Patient verbalizes understanding of discharge instructions. Opportunity for questioning and answers were provided. Armband removed by staff, pt discharged from ED ambulatory.   

## 2019-09-28 NOTE — ED Triage Notes (Signed)
Pt bib ems for syncopal episode while going up the stairs at her grand daughters house. Family helped pt to the couch, unresponsive for about 10 mins but not unconscious. EMS arrived and pt was responsive, hypotensive 82/44 with weak radial pulses. Pt given 215ml saline en route, last BP 102/62, pt a.o, has no complaints. MI and stroke last year.

## 2019-09-28 NOTE — ED Notes (Signed)
Pt given decaf coffee and sandwich bag

## 2019-09-30 LAB — URINE CULTURE: Culture: 90000 — AB

## 2019-10-01 ENCOUNTER — Telehealth: Payer: Self-pay | Admitting: Emergency Medicine

## 2019-10-01 NOTE — Telephone Encounter (Signed)
Post ED Visit - Positive Culture Follow-up  Culture report reviewed by antimicrobial stewardship pharmacist: [place] Team []  Elenor Quinones, Pharm.D. []  Heide Guile, Pharm.D., BCPS AQ-ID []  Parks Neptune, Pharm.D., BCPS []  Alycia Rossetti, Pharm.D., BCPS []  [place], Pharm.D., BCPS, AAHIVP []  Legrand Como, Pharm.D., BCPS, AAHIVP []  Salome Arnt, PharmD, BCPS []  Johnnette Gourd, PharmD, BCPS [x]  Hughes Better, PharmD, BCPS []  Leeroy Cha, PharmD []  Laqueta Linden, PharmD, BCPS []  Albertina Parr, PharmD  [place] Team []  Leodis Sias, PharmD []  Lindell Spar, PharmD []  Royetta Asal, PharmD []  Graylin Shiver, Rph []  Rema Fendt) Glennon Mac, PharmD []  Arlyn Dunning, PharmD []  Netta Cedars, PharmD []  Dia Sitter, PharmD []  Leone Haven, PharmD []  Gretta Arab, PharmD []  Theodis Shove, PharmD []  Peggyann Juba, PharmD []  Reuel Boom, PharmD   Positive *urine culture Treated with Levofloxacin, organism sensitive to the same and no further patient follow-up is required at this time.  Sandi Raveling Cassidy Tabet 10/01/2019, 4:18 PM
